# Patient Record
Sex: Female | Born: 1955 | Race: White | Hispanic: No | Marital: Single | Smoking: Former smoker
Health system: Southern US, Community
[De-identification: ages and names within clinical notes are randomized; demographics above are authoritative.]

## PROBLEM LIST (undated history)

## (undated) DIAGNOSIS — Z9289 Personal history of other medical treatment: Secondary | ICD-10-CM

## (undated) DIAGNOSIS — I639 Cerebral infarction, unspecified: Secondary | ICD-10-CM

## (undated) DIAGNOSIS — R531 Weakness: Secondary | ICD-10-CM

## (undated) DIAGNOSIS — I10 Essential (primary) hypertension: Secondary | ICD-10-CM

## (undated) DIAGNOSIS — S82899A Other fracture of unspecified lower leg, initial encounter for closed fracture: Secondary | ICD-10-CM

## (undated) DIAGNOSIS — F0390 Unspecified dementia without behavioral disturbance: Secondary | ICD-10-CM

## (undated) HISTORY — DX: Unspecified dementia, unspecified severity, without behavioral disturbance, psychotic disturbance, mood disturbance, and anxiety: F03.90

## (undated) HISTORY — PX: CATARACT EXTRACTION, BILATERAL: SHX1313

## (undated) HISTORY — PX: TUBAL LIGATION: SHX77

---

## 2007-02-03 DIAGNOSIS — I639 Cerebral infarction, unspecified: Secondary | ICD-10-CM

## 2007-02-03 HISTORY — DX: Cerebral infarction, unspecified: I63.9

## 2007-03-14 ENCOUNTER — Encounter (INDEPENDENT_AMBULATORY_CARE_PROVIDER_SITE_OTHER): Payer: Self-pay | Admitting: Cardiology

## 2007-03-14 ENCOUNTER — Inpatient Hospital Stay (HOSPITAL_COMMUNITY): Admission: EM | Admit: 2007-03-14 | Discharge: 2007-03-17 | Payer: Self-pay | Admitting: Emergency Medicine

## 2007-03-15 ENCOUNTER — Encounter (INDEPENDENT_AMBULATORY_CARE_PROVIDER_SITE_OTHER): Payer: Self-pay | Admitting: Internal Medicine

## 2007-03-16 ENCOUNTER — Encounter: Payer: Self-pay | Admitting: Internal Medicine

## 2007-03-23 ENCOUNTER — Encounter: Payer: Self-pay | Admitting: Interventional Radiology

## 2007-03-23 ENCOUNTER — Ambulatory Visit: Payer: Self-pay | Admitting: Hematology & Oncology

## 2007-04-08 LAB — CBC WITH DIFFERENTIAL/PLATELET
BASO%: 0.4 % (ref 0.0–2.0)
Basophils Absolute: 0 10*3/uL (ref 0.0–0.1)
Eosinophils Absolute: 0.7 10*3/uL — ABNORMAL HIGH (ref 0.0–0.5)
HCT: 37.3 % (ref 34.8–46.6)
HGB: 12.8 g/dL (ref 11.6–15.9)
MCH: 29.7 pg (ref 26.0–34.0)
MCV: 86.2 fL (ref 81.0–101.0)
MONO#: 1 10*3/uL — ABNORMAL HIGH (ref 0.1–0.9)
NEUT#: 8 10*3/uL — ABNORMAL HIGH (ref 1.5–6.5)
Platelets: 256 10*3/uL (ref 145–400)
RDW: 13.1 % (ref 11.3–14.5)
WBC: 13.3 10*3/uL — ABNORMAL HIGH (ref 3.9–10.0)
lymph#: 3.4 10*3/uL — ABNORMAL HIGH (ref 0.9–3.3)

## 2007-04-14 LAB — HYPERCOAGULABLE PANEL, COMPREHENSIVE RET.
Anticardiolipin IgA: 20 [APL'U] (ref <13)
Beta-2-Glycoprotein I IgM: <4 U/mL (ref <10)
Homocysteine: 9.4 umol/L (ref 4.0–15.4)
Protein S Ag, Total: 166 % — ABNORMAL HIGH (ref 70–140)

## 2007-04-15 ENCOUNTER — Ambulatory Visit: Payer: Self-pay | Admitting: Family Medicine

## 2007-04-18 ENCOUNTER — Ambulatory Visit: Payer: Self-pay | Admitting: *Deleted

## 2007-04-27 ENCOUNTER — Encounter: Admission: RE | Admit: 2007-04-27 | Discharge: 2007-07-01 | Payer: Self-pay | Admitting: Family Medicine

## 2007-05-16 ENCOUNTER — Ambulatory Visit: Payer: Self-pay | Admitting: Internal Medicine

## 2007-05-16 ENCOUNTER — Encounter: Payer: Self-pay | Admitting: Family Medicine

## 2007-05-16 LAB — CONVERTED CEMR LAB
BUN: 14 mg/dL (ref 6–23)
Calcium: 9.7 mg/dL (ref 8.4–10.5)
Chloride: 101 meq/L (ref 96–112)
Creatinine, Ser: 0.79 mg/dL (ref 0.40–1.20)
Potassium: 3.7 meq/L (ref 3.5–5.3)

## 2007-06-15 ENCOUNTER — Ambulatory Visit: Payer: Self-pay | Admitting: Internal Medicine

## 2007-07-25 ENCOUNTER — Ambulatory Visit: Payer: Self-pay | Admitting: Internal Medicine

## 2007-10-17 ENCOUNTER — Ambulatory Visit: Payer: Self-pay | Admitting: Family Medicine

## 2007-10-17 ENCOUNTER — Encounter: Payer: Self-pay | Admitting: Family Medicine

## 2007-10-17 LAB — CONVERTED CEMR LAB
Chlamydia, DNA Probe: NEGATIVE
GC Probe Amp, Genital: NEGATIVE

## 2008-10-17 ENCOUNTER — Ambulatory Visit: Payer: Self-pay | Admitting: Family Medicine

## 2008-10-17 ENCOUNTER — Encounter: Payer: Self-pay | Admitting: Family Medicine

## 2008-10-17 LAB — CONVERTED CEMR LAB
ALT: 17 units/L (ref 0–35)
AST: 27 units/L (ref 0–37)
Alkaline Phosphatase: 93 units/L (ref 39–117)
BUN: 12 mg/dL (ref 6–23)
Basophils Absolute: 0 10*3/uL (ref 0.0–0.1)
Creatinine, Ser: 0.72 mg/dL (ref 0.40–1.20)
HDL: 33 mg/dL — ABNORMAL LOW (ref >39)
LDL Cholesterol: 182 mg/dL — ABNORMAL HIGH (ref 0–99)
Lymphocytes Relative: 29 % (ref 12–46)
MCHC: 31.2 g/dL (ref 30.0–36.0)
MCV: 92.7 fL (ref 78.0–100.0)
Platelets: 197 10*3/uL (ref 150–400)
TSH: 19.48 microintl units/mL — ABNORMAL HIGH (ref 0.350–4.500)
Total CHOL/HDL Ratio: 7.8
Triglycerides: 206 mg/dL — ABNORMAL HIGH (ref <150)
WBC: 9.9 10*3/uL (ref 4.0–10.5)

## 2008-11-04 ENCOUNTER — Emergency Department (HOSPITAL_COMMUNITY): Admission: EM | Admit: 2008-11-04 | Discharge: 2008-11-04 | Payer: Self-pay | Admitting: Family Medicine

## 2008-11-16 ENCOUNTER — Ambulatory Visit: Payer: Self-pay | Admitting: Internal Medicine

## 2008-11-16 ENCOUNTER — Encounter: Payer: Self-pay | Admitting: Family Medicine

## 2008-11-27 ENCOUNTER — Encounter: Admission: RE | Admit: 2008-11-27 | Discharge: 2009-02-01 | Payer: Self-pay | Admitting: Family Medicine

## 2009-01-21 ENCOUNTER — Ambulatory Visit: Payer: Self-pay | Admitting: Internal Medicine

## 2009-01-21 LAB — CONVERTED CEMR LAB: Free T4: 1.01 ng/dL (ref 0.80–1.80)

## 2009-02-21 ENCOUNTER — Ambulatory Visit: Payer: Self-pay | Admitting: Internal Medicine

## 2009-03-21 ENCOUNTER — Ambulatory Visit: Payer: Self-pay | Admitting: Internal Medicine

## 2009-03-21 ENCOUNTER — Encounter (INDEPENDENT_AMBULATORY_CARE_PROVIDER_SITE_OTHER): Payer: Self-pay | Admitting: Adult Health

## 2009-03-21 LAB — CONVERTED CEMR LAB
Cholesterol: 161 mg/dL (ref 0–200)
Microalb, Ur: 0.63 mg/dL (ref 0.00–1.89)
TSH: 7.66 microintl units/mL — ABNORMAL HIGH (ref 0.350–4.500)
Total CHOL/HDL Ratio: 6
Triglycerides: 304 mg/dL — ABNORMAL HIGH (ref <150)

## 2009-05-08 ENCOUNTER — Ambulatory Visit: Payer: Self-pay | Admitting: Internal Medicine

## 2009-07-08 ENCOUNTER — Ambulatory Visit: Payer: Self-pay | Admitting: Internal Medicine

## 2009-07-08 ENCOUNTER — Encounter (INDEPENDENT_AMBULATORY_CARE_PROVIDER_SITE_OTHER): Payer: Self-pay | Admitting: Adult Health

## 2009-07-08 LAB — CONVERTED CEMR LAB
ALT: 13 units/L (ref 0–35)
AST: 23 units/L (ref 0–37)
Albumin: 4.3 g/dL (ref 3.5–5.2)
Alkaline Phosphatase: 151 units/L — ABNORMAL HIGH (ref 39–117)
Calcium: 9.5 mg/dL (ref 8.4–10.5)
Chloride: 100 meq/L (ref 96–112)
Creatinine, Ser: 1.71 mg/dL — ABNORMAL HIGH (ref 0.40–1.20)
TSH: 8.76 microintl units/mL — ABNORMAL HIGH (ref 0.350–4.500)
Vit D, 25-Hydroxy: 25 ng/mL — ABNORMAL LOW (ref 30–89)

## 2009-07-12 ENCOUNTER — Emergency Department (HOSPITAL_COMMUNITY): Admission: EM | Admit: 2009-07-12 | Discharge: 2009-07-12 | Payer: Self-pay | Admitting: Family Medicine

## 2009-08-15 ENCOUNTER — Encounter: Admission: RE | Admit: 2009-08-15 | Discharge: 2009-08-15 | Payer: Self-pay | Admitting: Orthopedic Surgery

## 2009-08-23 ENCOUNTER — Encounter: Admission: RE | Admit: 2009-08-23 | Discharge: 2009-08-23 | Payer: Self-pay | Admitting: Family Medicine

## 2010-02-23 ENCOUNTER — Encounter: Payer: Self-pay | Admitting: Family Medicine

## 2010-06-17 NOTE — Discharge Summary (Signed)
NAMEROYALTY, FAKHOURI               ACCOUNT NO.:  [id number]   MEDICAL RECORD NO.:  [id number]          PATIENT TYPE:  INP   LOCATION:  1440                         FACILITY:  [facility]   PHYSICIAN:  Elliot Cousin, M.D.    DATE OF BIRTH:  1955/05/23   DATE OF ADMISSION:  03/14/2007  DATE OF DISCHARGE:  03/17/2007                               DISCHARGE SUMMARY   DISCHARGE DIAGNOSES:  1. Acute left middle cerebellar stroke per magnetic resonance imaging      of the brain.  2. 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  3. 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  Approximately 50% 60% stenosis of      the left internal carotid artery at the cervical-petrous junction.      Mild arthrosclerosis changes involving the cervical-petrous      junction on the right side with a broad-based outpouching at the      superior hypophyseal region.  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  4. Ataxia, secondary to #1.  5. Speech impediment, secondary to chronic hearing loss.  6. Malignant hypertension.  7. Hyperlipidemia.  8. Leukocytosis of unknown origin.  9. Tobacco abuse.   DISCHARGE MEDICATIONS:  1. Metoprolol 25 mg b.i.d.  2. Hydrochlorothiazide 25 mg daily.  3. Potassium chloride 20 mEq daily.  4. Aspirin 325 mg daily.  5. Lovastatin 20 mg nightly.  6. Multivitamin with iron once daily.   DISCHARGE DISPOSITION:  The patient was discharged to home in improved  and stable condition on March 17, 2007.  She has an appointment to  follow up with  interventional radiologist, Dr. Corliss Skains, on Wednesday,  February 18, at 11 o'clock a.m.  She was given information on the  [facility] and advised to make an appointment within the next  week.   CONSULTATIONS:  Bevelyn Buckles. Champey, MD   PROCEDURES PERFORMED:  1. MRI of the brain on March 14, 2007.  2. MRA of the head and neck on March 14, 2007.  3. Four-vessel angiogram on March 16, 2007.  4. A 2-D echocardiogram on March 15, 2007.  The results revealed      overall left ventricular systolic function was vigorous.  Ejection      fraction estimated to be 65% to 75%.  Left ventricular wall      thickness was mildly increased.  Left ventricular diastolic      function parameters were normal.  No likely discrete intracardiac      source of embolism apparent.   HISTORY OF PRESENT ILLNESS:  The patient is a 55 year old woman with a  past medical history significant for long-standing tobacco abuse who  presented to the emergency department on March 14, 2007, with a chief  complaint of losing her balance when she walks.  The loss of balance was  not vertiginous rather she just felt dizzy.  The dizziness was  associated with several episodes of nausea and vomiting.  During the  evaluation in the emergency department, the patient was noted to be  ataxic.  Her lab data were significant for a serum potassium of 3 and an  elevated white blood cell count of 16.3.  The MRI of the brain was  ordered in the emergency department, and it revealed an acute left  middle cerebellar stroke.  The patient was, therefore, admitted for  further evaluation and management.   For additional details, please see the dictated History and Physical.   HOSPITAL COURSE:  1. ACUTE LEFT MIDDLE CEREBELLAR STROKE AND SIGNIFICANT POSTERIOR      CIRCULATION CEREBROVASCULAR DISEASE.  The patient was initially      evaluated by neurologist, Dr. Nash Shearer, in the emergency department.      He ordered an MRI  and MRA of the brain and neck.  The results of      the MRI were significant for a punctate subacute infarct in the      left middle cerebral peduncle and remote lacunar infarcts on the      left.  The MRA of the head and neck revealed probable severe high-      grade stenoses in the vertebrobasilar, particularly on the right.      Once these findings were realized, the patient was started on      aspirin therapy 325 mg daily.  Dr. Nash Shearer recommended assessing      the patient further with a 2-D echocardiogram, fasting lipid panel,      homocystine level, and eventually with a cerebral angiogram.  The      patient's fasting lipid panel revealed a total cholesterol of 207,      triglycerides of 266, HDL of 24, and LDL of 130.  The patient was      started on Zocor nightly.  Her homocysteine level was within normal      limits at 8.7.  Her TSH was within normal limits at 5.4.  Her 2-D      echocardiogram revealed preserved LV function and no obvious      intracardiac source of emboli.  Two days after hospital admission,      the patient underwent a 4-vessel cerebral angiogram as performed by      the radiologist.  The results were significant for advanced      stenotic lesions in the right posterior circulation.  These results      were reviewed with Dr. Nash Shearer and Dr. Corliss Skains and colleagues.      They recommended further evaluation in the outpatient setting for      possible intervention; therefore, the patient has been scheduled to      meet with Dr. Corliss Skains on Wednesday, February 18, at 11 o'clock      a.m.  During the hospital course, the patient received physical and      occupational therapy.  She did have some ataxia as indicated by the      therapist and the neurological evaluation that followed.  The      therapists recommended home health therapies to improve her  balance.  Equipment such as a 3-in-1 commode and rolling walker      were ordered prior to hospital  discharge.  2. MALIGNANT HYPERTENSION.  At the time of the initial hospital      assessment, the patient was malignantly hypertensive with a blood      pressure of 194/103.  She was started on antihypertensive treatment      with metoprolol 50 mg q.6 h.  Over the course of the      hospitalization, the patient became bradycardic and, therefore, the      metoprolol dosing was decreased to 25 mg q.6 h. Norvasc and p.r.n.      hydralazine were added.  Eventually, hydrochlorothiazide was added      for better blood pressure control.  Her blood pressures improved      significantly; however, they were not ideal.  The patient was      counseled on a heart healthy diet during the hospital course.  She      was discharged  to home on a regimen of metoprolol,      hydrochlorothiazide, and potassium chloride for supplemental      support of the hypokalemia.  3. HYPERLIPIDEMIA.  The patient's fasting lipid panel was assessed and      the results are above.  She was started on Zocor during the      hospital course and discharged to home on lovastatin.  4. TOBACCO ABUSE.  The patient was strongly advised to stop smoking as      it increased her risk of stroke or recurrent strokes.  The patient      was very receptive and vowed not to smoke again.  She did receive      official tobacco cessation counseling during her hospital course.  5. LEUKOCYTOSIS OF UNKNOWN ORIGIN.  At the time of the initial      hospital assessment, the patient's white blood cell count was 16.3.      She was completely afebrile and remained afebrile during the entire      hospital course.  An urinalysis was ordered and revealed no      evidence of WBCs, leukocytes, or nitrites.  A chest x-ray was      ordered at the time of the initial hospital assessment and found to      have no evidence of infiltrates.  A follow-up chest x-ray was      ordered as well and revealed only mild bibasilar atelectasis.  The      patient had no  complaints of upper respiratory infection symptoms,      cough, abdominal pain, diarrhea, or painful urination.  The urine      culture did grow out greater than 100,000 colonies of multiple      bacterial morphotypes, but none predominant. There was no clinical      indication to start an antibiotic.  Her white blood cell count      increased to 19.8.  A hematology consult was not obtained during      the hospital course; however, one will be arranged in the      outpatient setting.  Of note, her sed rate was within normal limits      at 22.      Elliot Cousin, M.D.  Electronically Signed     DF/MEDQ  D:  03/22/2007  T:  03/22/2007  Job:  [id number]   cc:   Melvern Banker  Fax:  [phone]   Bevelyn Buckles. Nash Shearer, M.D.  Fax: [phone]

## 2010-06-17 NOTE — H&P (Signed)
[name], [name]               ACCOUNT NO.:  [id number]   MEDICAL RECORD NO.:  [id number]          PATIENT TYPE:  EMS   LOCATION:  ED                           FACILITY:  [facility]   PHYSICIAN:  Darryl D. Prime, MD    DATE OF BIRTH:  Feb 06, 1955   DATE OF ADMISSION:  03/14/2007  DATE OF DISCHARGE:                              HISTORY & PHYSICAL   The patient is full code.  She has no primary care physician.   Total visit time was approximately 58 minutes.   The patient's daughter was the primary historian.  She is a good  historian.   CHIEF COMPLAINT:  Losing balance, nausea and vomiting.   HISTORY OF PRESENT ILLNESS:  Ms. [name] is a 55 year old female with a  history of longstanding tobacco abuse since she was a teenager, one pack  per day, history of possible hypertension, poorly controlled, history of  possible diabetes, not controlled.  Has not seen a doctor for over 30  years, who has over the last eight days had nausea, vomiting, nonbloody.  The patient apparently had a sudden onset of paroxysmal.  The nausea and  vomiting did go away two days prior to admission but 3-4 days ago, she  has been having problems getting out of bed.  When she does get out of  bed, she does have significant apparent ataxia, where she holds onto the  bed rails and to the walls to walk around, which has caused her to stay  in the bed more than usual.  The daughter was significantly concerned  about this, and they presented to the emergency room at [facility]  today.  The patient currently denies any pain, chest pain.  She denies  any visual disturbance.  She denies any apparent vertigo symptoms.  The  patient denies any black or bloody stools.  She notes a decreased p.o.  intake significantly so and some lightheadedness when she does try to  get up.  No syncope.  Patient denies any lower extremity edema,  paroxysmal nocturnal dyspnea, or orthopnea.  She denies any rashes.  She  denies any joint  pain.  In the emergency room, she was given 40 mEq of  potassium chloride.   An MRI was performed of the brain which showed a remote left basal  ganglia possible infarct and a punctate subacute stroke on the left  middle cerebellar peduncle.  There was a concern for a possible  vertebral dissection, but an MRA did not show any dissection.  The  patient was evaluated by [facility] tonight, and  they made recommendations in the emergency room.   PAST MEDICAL/SURGICAL HISTORY:  History of speech impediment and hearing  loss, which she was born with.  She reads lips.  The patient has not  seen a doctor in quiet a while.   ALLERGIES:  No known drug allergies.   MEDICATIONS:  She takes a multivitamin occasionally.   SOCIAL HISTORY:  History of tobacco abuse, as above.  The patient also  has a history of tobacco abuse, as above.  No alcohol abuse.  She is  widowed.   FAMILY HISTORY:  Patient is adopted, but her daughter has a history of  hearing loss.  Apparently, the hearing loss and the associated speech  impediment is hereditary.   REVIEW OF SYSTEMS:  The 14-point review of systems is negative unless  stated above.   PHYSICAL EXAMINATION:  VITAL SIGNS:  Temperature is 97.4 with a blood  pressure of 194/103, 199/115 on the left arm and 188/98 on the right  arm.  The patient's sats are 98% on room air.  GENERAL:  She is a female who looks her stated age, lying in bed in no  acute distress.  HEENT:  Normocephalic and atraumatic.  Pupils are equal, round and  reactive to light.  Extraocular movements are intact.  There is no gross  nystagmus.  The oropharynx shows no lesions.  There is significant  desiccation signs of hypokalemia.  The patient has a mild droop on the  right side facially.  NECK:  Supple with no lymphadenopathy or thyromegaly.  LUNGS:  Clear to auscultation bilaterally.  CARDIOVASCULAR:  Regular rate and rhythm with no murmurs, rubs or   gallops.  Normal S1 and S2.  No S3 or S4.  ABDOMEN:  Soft, nontender, nondistended with no hepatosplenomegaly.  EXTREMITIES:  No clubbing, cyanosis or edema.  NEUROLOGIC:  The sensation and strength is grossly intact.  She does  have a normal finger-to-nose on the left and also a normal heel-to-shin  on the left.  Unremarkable on the right.  The patient was not getting up  and trying to walk.  Patient is alert and oriented x4.  Cranial nerves  II-XII were grossly intact.   Patient's laboratory data shows a sodium of 142, potassium 3, chloride  98, bicarb 30, BUN 9, creatinine 0.7, glucose 147, calcium 9.7.  White  count is elevated at 16.3 with a hemoglobin of 14.6.  Hematocrit of  42.8.  MCV is normal with platelets of 225, segs 67%.   MRI is as above.  MRA is as above.   ASSESSMENT/PLAN:  Patient with a history of left subacute cerebellar  stroke with associated signs and symptoms.  She will be admitted to the  ICU due to the significantly elevated blood pressure.  Will try to  control her blood pressure with beta blockade, nitrates, and gradually  get it down into the range of 140-180 systolic.  We will follow her  neuro status closely with neuro checks.  The head of bed will be  elevated.  Neurology will follow.  We will check a urine drug screen,  EKG, and chest x-ray.  We will get transcranial Doppler versus angiogram  cerebellar to evaluate and completely exclude a possible dissection.  Physical therapy, occupational therapy, case management, and speech  therapy will be consulted.  She will be help n.p.o., and I will get a  swallowing evaluation.  When the results of the chest x-ray are back, if  there are any signs of widened mediastinum, we will get a CT of the  chest to rule out dissection.  We will check lipid panel, PT/INR, PTT.  For her hypovolemia, we will give IV fluids, as this may help with her  stroke symptoms as well.  We will follow in's and out's closely,   however, and her weight.  For her elevated white blood cell count, check  a urinalysis and chest x-ray, as above, to rule out any occult  infection.  For her elevated glucose, will check  a hemoglobin A1C,  although this may reflect stress from her stroke.  Check a TSH as well.  For hypertension, we will control as best possible, as above.  Prophylaxis with her proton pump inhibitor and Colace.  Also, DVT  prophylaxis will be with pneumatic compression devices and then with  heparin or Lovenox when her blood pressure is better controlled.  For  her low potassium, will follow and replete as needed.  She is a full  code.      Darryl D. Prime, MD  Electronically Signed     DDP/MEDQ  D:  03/14/2007  T:  03/15/2007  Job:  [id number]

## 2010-06-17 NOTE — Consult Note (Signed)
[name], [name]               ACCOUNT NO.:  [id number]   MEDICAL RECORD NO.:  [id number]          PATIENT TYPE:  OUT   LOCATION:  XRAY                         FACILITY:  [facility]   PHYSICIAN:  [name], M.D.DATE OF BIRTH:  05-04-1955   DATE OF CONSULTATION:  03/23/2007  DATE OF DISCHARGE:                                 CONSULTATION   CHIEF COMPLAINT:  Cerebrovascular disease.   HISTORY OF PRESENT ILLNESS:  This is a 55 year old female who was  admitted [facility] on March 14, 2007, with a subacute left  cerebellar CVA associated with severe hypertension.  The patient was not  felt to be a candidate for t-PA as she was greater than 3 hours out from  her stroke.  She was given aspirin.  A cerebral angiogram was performed  by Dr. Corliss Bush on March 16, 2007.  This showed an 80-85% stenosis  of the right vertebral artery origin.  There was a distal 60-70%  stenosis.  There was a 50-60% left internal carotid artery stenosis.  There was an outpouching of the right superior hypophyseal region.  She  also had a 70-80% distal basilar artery stenosis.  The patient was asked  to return today to discuss treatment options with Dr. Corliss Bush.  She is  accompanied by her daughter.   PAST MEDICAL HISTORY:  The patient did have a 2-D echo while in the  hospital on March 15, 2007.  This showed an ejection fraction of 65-  75%.  There were no left ventricular wall motion abnormalities noted.  There is no source of emboli noted.  It was questioned whether or not  the patient may need a TEE.  There was a question of untreated diabetes,  question of untreated hypertension.  The patient had not seen a  physician in approximately 30 years prior to this admission.  She has a  history of tobacco use with probable COPD.  I believes she recently quit  smoking.  She was born with a speech impediment as well as hearing loss.  She does read lips.   SURGICAL HISTORY:  The patient  has had no major surgeries.   ALLERGIES:  No known drug allergies.  She had no problems with the  contrast dye at her last angiogram.   CURRENT MEDICATIONS:  1. Metoprolol b.i.d.  2. Hydrochlorothiazide daily.  3. K-Dur 10 mEq b.i.d.  4. Aspirin 325 mg daily.  5. Lovastatin one at bedtime.   We do not have all of the dosages of these medications.  The patient's  daughter provided [place] with the list.  Dr. Corliss Bush recommended starting  Plavix 75 mg daily.   SOCIAL HISTORY:  The patient is widowed.  She has two children, a  supportive daughter and a son with whom she has little contact.  [place]  [place].  She had smoked one pack  cigarettes per day for greater than 30 years.  She is trying to quit.  She does not use alcohol.  She worked [employer] as well as at a [employer].  She is  now on disability.   FAMILY HISTORY:  The patient is adopted.  She knows nothing of her birth  parents.   IMPRESSION AND PLAN:  As noted, the patient presents today accompanied  by her daughter to discuss further treatment options for her  cerebrovascular disease.  Dr. Corliss Bush reviewed the results of the  angiogram with the patient and her daughter.  He pointed out the areas  of stenosis.  He felt that she would be a candidate for stenting of the  right vertebral artery due to the 80-85% stenosis.  It was explained  that this treatment was mainly to prevent future strokes and would do  nothing to improve the stroke which she had already experienced.  The  procedure was described in detail along with the risks and benefits.  The patient was also told that she would probably be at risk for further  strokes if she had no further treatment at this time.   As noted, we did give the patient a prescription for Plavix #30 with one  refill to be taken one daily.  The patient and her daughter would like  to give this situation some more thought.  They will contact us if  they  wish to proceed with further intervention.  Greater than 40 minutes was  spent on this consult.      [name], P.A.    ______________________________  Leslie Silos. [name], M.D.    DR/MEDQ  D:  03/23/2007  T:  03/24/2007  Job:  [id number]   cc:   Leslie P. Pearlean Brownie, MD

## 2010-06-17 NOTE — Consult Note (Signed)
NAMEAMBRIA, [name]               ACCOUNT NO.:  [id number]   MEDICAL RECORD NO.:  [id number]          PATIENT TYPE:  EMS   LOCATION:  ED                           [facility]ACILITY[facility]   PHYSICIAN:  Bevelyn Buckles. Champey, M.D.DATE OF BIRTH:  1955-11-10   DATE OF CONSULTATION:  03/14/2007  DATE OF DISCHARGE:                                 CONSULTATION   REASON FOR CONSULTATION:  Stroke.   HISTORY OF PRESENT ILLNESS:  [name] is a 55 year old Caucasian female  with past medical history of questionable hypertension (the patient has  not seen a doctor or been followed by a physician for several years) who  presented with a 5-day history of feeling off balance.  Eight days ago  she started having nausea and vomiting for a few days that resolved.  She also had a slight headache at that time for a few days.  She did not  get up out of bed secondary to feeling sick.  Then last Wednesday she  was noticed to be very unsteady on her feet and daughter stated she was  always going to the left.  She denies any focal weakness, numbness,  vision changes, speech changes, falls or loss of consciousness.  MRI of  the brain done in the ED showed a small left cerebellar peduncle infarct  with questionable left vertebral artery dissection versus plaque.  MRA  was found to follow.  That showed extensive atherosclerosis of all  vessels without significant dissection.  This was reviewed with  radiology.   PAST MEDICAL HISTORY:  Positive for hypertension and questionable  diabetes.  She states her that her sugar has been elevated;  however,  she does not follow with a doctor.   CURRENT MEDICATIONS:  Multivitamin.   ALLERGIES:  None.   FAMILY HISTORY:  Unknown as the patient is adopted.   SOCIAL HISTORY:  The patient lives with daughter.  Smokes one pack of  cigarettes per day for over 15 years.  She denies any alcohol or drug  use.   REVIEW OF SYSTEMS:  Positive as per HPI.  Review of systems negative as  per HPI in greater than seven other organ systems.   EXAMINATION:  VITALS:  Temperature is 97.4, blood pressure is 194/105 to  234/110, pulse 95, respirations 20.  HEENT:  Normocephalic, atraumatic.  Extraocular muscles are intact.  Pupils equal, round and react to light.  NECK:  Supple.  No carotid bruits.  HEART:  Regular.  LUNGS:  Clear.  ABDOMEN:  Soft.  EXTREMITIES:  Good pulses.  NEUROLOGIC:  The patient is awake, alert and oriented.  Language is  slightly dysarthric but  in her baseline.  Cranial nerves II-XII were  grossly intact.  Motor examination shows good strength in all four  extremities.  No drift is noted.  Sensory examination is within normal  limits to light touch.  Reflexes are 1+ throughout.  Toes are neutral  bilaterally.  Cerebellar function is within normal limits, finger-to-  nose and heel-to-shin.  Gait was not assessed secondary to safety.   MRI of the brain shows small  left cerebellar peduncle infarct with  questionable left surgical artery dissection versus plaque.  MRA  preliminary showed no significant dissection with extensive  atherosclerosis and a narrow basilar artery/posterior circulation.   LABS:  WBC 16.3, hemoglobin 14.6, hematocrit 42.8, platelets 225.  Sodium is 140, potassium is 3.0, chloride is 98, CO2 is 30, BUN 9,  creatinine 0.70.  Glucose 148, calcium is 9.7.   IMPRESSION:  This is a 55 year old with a new infarct and significant  atherosclerosis as described above.  The patient not a candidate for IV  t-PA as symptoms greater than 3 hours out.  Recommend starting an  aspirin per day.  Reviewed the MRI/MRA with Dr. Chestine Spore of radiology.  I  would recommend getting a cerebral angiogram to evaluate posterior  circulation and narrow basilar artery.  I have also recommended checking  a 2-D echocardiogram, fasting lipids and homocysteine level, keep  systolic blood pressure less than 190 and can use labetalol p.r.n. if  needed.  Get PT, OT  consults.  Will follow the patient as consultants.      Bevelyn Buckles. Nash Shearer, M.D.  Electronically Signed     DRC/MEDQ  D:  03/14/2007  T:  03/16/2007  Job:  [id number]

## 2010-09-26 ENCOUNTER — Other Ambulatory Visit: Payer: Self-pay | Admitting: Family Medicine

## 2010-09-26 DIAGNOSIS — Z1231 Encounter for screening mammogram for malignant neoplasm of breast: Secondary | ICD-10-CM

## 2010-09-29 ENCOUNTER — Ambulatory Visit
Admission: RE | Admit: 2010-09-29 | Discharge: 2010-09-29 | Disposition: A | Discharge status: Home / Self Care | Payer: Medicare Other | Source: Ambulatory Visit | Attending: Family Medicine | Admitting: Family Medicine

## 2010-09-29 ENCOUNTER — Ambulatory Visit: Payer: Self-pay

## 2010-09-29 DIAGNOSIS — Z1231 Encounter for screening mammogram for malignant neoplasm of breast: Secondary | ICD-10-CM

## 2010-10-24 LAB — BASIC METABOLIC PANEL
BUN: 9
CO2: 23
CO2: 31
Calcium: 8.9
Calcium: 9.4
Calcium: 9.7
Chloride: 98
Chloride: 99
Creatinine, Ser: 0.64
GFR calc Af Amer: >60
GFR calc Af Amer: >60
GFR calc Af Amer: >60
GFR calc non Af Amer: >60
Glucose, Bld: 103 — ABNORMAL HIGH
Sodium: 131 — ABNORMAL LOW

## 2010-10-24 LAB — PROTIME-INR
INR: 1.1
Prothrombin Time: 13.9

## 2010-10-24 LAB — APTT: aPTT: 33

## 2010-10-24 LAB — BASIC METABOLIC PANEL WITH GFR
CO2: 30
Creatinine, Ser: 0.7
Glucose, Bld: 148 — ABNORMAL HIGH
Potassium: 3 — ABNORMAL LOW
Sodium: 140

## 2010-10-24 LAB — DIFFERENTIAL
Basophils Absolute: 0
Basophils Absolute: 0.1
Basophils Relative: 0
Basophils Relative: 0
Basophils Relative: 1
Eosinophils Absolute: 1 — ABNORMAL HIGH
Eosinophils Relative: 6 — ABNORMAL HIGH
Lymphocytes Relative: 23
Lymphs Abs: 3.7
Lymphs Abs: 4
Monocytes Absolute: 0.7
Monocytes Absolute: 1.1 — ABNORMAL HIGH
Monocytes Relative: 5
Monocytes Relative: 6
Neutro Abs: 10.8 — ABNORMAL HIGH
Neutro Abs: 12.9 — ABNORMAL HIGH
Neutro Abs: 13.9 — ABNORMAL HIGH
Neutrophils Relative %: 66
Neutrophils Relative %: 67

## 2010-10-24 LAB — URINALYSIS, MICROSCOPIC ONLY
Bilirubin Urine: NEGATIVE
Glucose, UA: NEGATIVE
Hgb urine dipstick: NEGATIVE
Ketones, ur: NEGATIVE
Leukocytes, UA: NEGATIVE
Nitrite: NEGATIVE
Protein, ur: NEGATIVE
Specific Gravity, Urine: 1.025
Urobilinogen, UA: 1
pH: 6

## 2010-10-24 LAB — RAPID URINE DRUG SCREEN, HOSP PERFORMED
Amphetamines: NOT DETECTED
Barbiturates: NOT DETECTED
Benzodiazepines: NOT DETECTED
Cocaine: NOT DETECTED
Opiates: NOT DETECTED
Tetrahydrocannabinol: NOT DETECTED

## 2010-10-24 LAB — URINALYSIS, ROUTINE W REFLEX MICROSCOPIC
Glucose, UA: NEGATIVE
Glucose, UA: NEGATIVE
Hgb urine dipstick: NEGATIVE
Specific Gravity, Urine: 1.016
pH: 6

## 2010-10-24 LAB — CBC
HCT: 42.8
Hemoglobin: 14.1
Hemoglobin: 14.6
MCHC: 34
MCHC: 34.1
MCHC: 34.6
MCV: 87.2
Platelets: 225
RBC: 4.44
RBC: 4.7
RBC: 4.9
RDW: 12.8
RDW: 12.9
WBC: 16.3 — ABNORMAL HIGH
WBC: 19.8 — ABNORMAL HIGH

## 2010-10-24 LAB — URINE MICROSCOPIC-ADD ON

## 2010-10-24 LAB — HOMOCYSTEINE: Homocysteine: 8.7

## 2010-10-24 LAB — SEDIMENTATION RATE: Sed Rate: 22

## 2010-10-24 LAB — HEMOGLOBIN A1C
Hgb A1c MFr Bld: 5.8
Mean Plasma Glucose: 129

## 2010-10-24 LAB — B-NATRIURETIC PEPTIDE (CONVERTED LAB): Pro B Natriuretic peptide (BNP): <30

## 2010-10-24 LAB — LIPID PANEL
HDL: 24 — ABNORMAL LOW
Triglycerides: 266 — ABNORMAL HIGH

## 2010-10-24 LAB — TSH: TSH: 5.404

## 2013-03-15 ENCOUNTER — Other Ambulatory Visit: Payer: Self-pay

## 2013-03-15 DIAGNOSIS — Z1231 Encounter for screening mammogram for malignant neoplasm of breast: Secondary | ICD-10-CM

## 2013-03-28 ENCOUNTER — Ambulatory Visit
Admission: RE | Admit: 2013-03-28 | Discharge: 2013-03-28 | Disposition: A | Discharge status: Home / Self Care | Payer: Medicare Other | Source: Ambulatory Visit

## 2013-03-28 DIAGNOSIS — Z1231 Encounter for screening mammogram for malignant neoplasm of breast: Secondary | ICD-10-CM

## 2016-04-02 ENCOUNTER — Other Ambulatory Visit (INDEPENDENT_AMBULATORY_CARE_PROVIDER_SITE_OTHER): Payer: Self-pay | Admitting: Orthopaedic Surgery

## 2016-04-02 ENCOUNTER — Encounter (INDEPENDENT_AMBULATORY_CARE_PROVIDER_SITE_OTHER): Payer: Self-pay | Admitting: Orthopaedic Surgery

## 2016-04-02 ENCOUNTER — Ambulatory Visit (INDEPENDENT_AMBULATORY_CARE_PROVIDER_SITE_OTHER): Payer: Medicare Other

## 2016-04-02 ENCOUNTER — Ambulatory Visit (INDEPENDENT_AMBULATORY_CARE_PROVIDER_SITE_OTHER): Payer: Medicare Other | Admitting: Orthopaedic Surgery

## 2016-04-02 DIAGNOSIS — M79604 Pain in right leg: Secondary | ICD-10-CM

## 2016-04-02 DIAGNOSIS — M25571 Pain in right ankle and joints of right foot: Secondary | ICD-10-CM

## 2016-04-02 DIAGNOSIS — S8261XA Displaced fracture of lateral malleolus of right fibula, initial encounter for closed fracture: Secondary | ICD-10-CM

## 2016-04-02 DIAGNOSIS — S8261XD Displaced fracture of lateral malleolus of right fibula, subsequent encounter for closed fracture with routine healing: Secondary | ICD-10-CM

## 2016-04-02 MED ORDER — HYDROCODONE-ACETAMINOPHEN 5-325 MG PO TABS: 1.0000 | ORAL_TABLET | Freq: Four times a day (QID) | ORAL | 0 refills | Status: DC | PRN

## 2016-04-02 NOTE — Progress Notes (Signed)
Office Visit Note   Patient: [name]           Date of Birth: 1955-11-11           MRN: [id number] Visit Date: 04/02/2016              Requested by: No referring provider defined for this encounter. PCP: [facility]   Assessment & Plan: Visit Diagnoses:  1. Closed displaced fracture of lateral malleolus of right fibula, initial encounter     Plan: Patient has displaced Weber C fibula fracture with widening of medial clear space concerning for syndesmotic injury. We will plan on operative fixation next week to allow for swelling to resolve first. He knows to keep it elevated and ice it at all times. She understands risks benefits alternatives to surgery and wishes to proceed. Pain medicine was prescribed today.  Follow-Up Instructions: Return for 2 week postop visit.   Orders:  Orders Placed This Encounter  Procedures  . XR Ankle Complete Right   Meds ordered this encounter  Medications  . HYDROcodone-acetaminophen (NORCO) 5-325 MG tablet    Sig: Take 1 tablet by mouth every 6 (six) hours as needed.    Dispense:  30 tablet    Refill:  0      Procedures: No procedures performed   Clinical Data: No additional findings.   Subjective: Chief Complaint  Patient presents with  . Right Ankle - Pain    Patient is a 61 year old female who comes in today for acute injury of her right ankle. She fell at her cardiologist office this morning and was sent here for further evaluation and treatment and concern for ankle fracture. She had a normal EKG and was unchanged from prior studies per the patient. She endorses severe pain in her ankle with movement and does not radiate.    Review of Systems  Constitutional: Negative.   HENT: Negative.   Eyes: Negative.   Respiratory: Negative.   Cardiovascular: Negative.   Endocrine: Negative.   Musculoskeletal: Negative.   Neurological: Negative.   Hematological: Negative.   Psychiatric/Behavioral:  Negative.   All other systems reviewed and are negative.    Objective: Vital Signs: There were no vitals taken for this visit.  Physical Exam  Constitutional: She is oriented to person, place, and time. She appears well-developed and well-nourished.  HENT:  Head: Normocephalic and atraumatic.  Eyes: EOM are normal.  Neck: Neck supple.  Pulmonary/Chest: Effort normal.  Abdominal: Soft.  Neurological: She is alert and oriented to person, place, and time.  Skin: Skin is warm. Capillary refill takes less than 2 seconds.  Psychiatric: She has a normal mood and affect. Her behavior is normal. Judgment and thought content normal.  Nursing note and vitals reviewed.   Ortho Exam Exam of the right ankle shows moderate swelling with intact pulses. The foot is warm well-perfused Specialty Comments:  No specialty comments available.  Imaging: Xr Ankle Complete Right  Result Date: 04/02/2016 Displaced Weber C fibula fracture with widening of medial clear space    PMFS History: There are no active problems to display for this patient.  No past medical history on file.  No family history on file.  No past surgical history on file. Social History   Occupational History  . Not on file.   Social History Main Topics  . Smoking status: Never Smoker  . Smokeless tobacco: Never Used  . Alcohol use Not on file  . Drug use: Unknown  .  Sexual activity: Not on file

## 2016-04-07 ENCOUNTER — Encounter (HOSPITAL_BASED_OUTPATIENT_CLINIC_OR_DEPARTMENT_OTHER): Payer: Self-pay | Admitting: *Deleted

## 2016-04-07 NOTE — Progress Notes (Signed)
Medical history reviewed with Dr.Germeroth. Because patient did not have the Stress Test completed which her cardiologist recommended following her echocardiogram, he feels that without this information, the patient is not a safe candidate to be done in the outpatient surgery setting. Called Dr Phoebe Sharps office and left message of same on Sherry's (scheduler) VM that patient needed to be moved to [place]. Called patient's daughter and informed her that they should hear from Dr Phoebe Sharps office and to contact them for questions.

## 2016-04-08 ENCOUNTER — Encounter (HOSPITAL_BASED_OUTPATIENT_CLINIC_OR_DEPARTMENT_OTHER): Payer: Self-pay | Admitting: *Deleted

## 2016-04-14 ENCOUNTER — Encounter (HOSPITAL_BASED_OUTPATIENT_CLINIC_OR_DEPARTMENT_OTHER): Payer: Self-pay | Admitting: *Deleted

## 2016-04-14 NOTE — Progress Notes (Signed)
Pt SDW-pre-op call completed by pt daughter, [name]. Daughter denies any acute cardiopulmonary issues. Daughter stated that pt is currently under the care of [facility]. Daughter not sure if pt hd a cardiac cath; records requested from Dr. Adalberto Ill. Daughter stated that pt last dose of Plavix was Thursday ( 04/09/16) . Daughter stated that pt does not take Aspirin. Daughter made aware to have pt stop taking vitamins, fish oil and herbal medications.  Do not administer any NSAIDs ie: Ibuprofen, Advil, Naproxen, BC and Goody Powder. Daughter verbalized understanding of all pre-op instructions. Anesthesia asked to review pt history ( see note).

## 2016-04-14 NOTE — Progress Notes (Signed)
Anesthesia Chart Review: SAME DAY WORK-UP.   Patient is a 61 year old female scheduled for ORIF right lateral malleolus and syndesmosis on 04/15/16 by Dr. Erlinda Hong. Anesthesia is posted as "Choice." She was moved from [facility] anesthesiologist Dr. Lissa Hoard because she was unable to finish recently ordered stress test (see below and 04/07/16 note by Mal Misty, RN).  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 Patient was adopted.   PCP is with [facility].  Cardiologist is Dr. Wynonia Lawman. He composed a letter on 04/08/16 stating, "May proceed with surgery from a cardiac viewpoint on the above named patient..." He last saw her on 04/02/16. She had an echo 03/2016 showing normal EF; however, she was also scheduled to have myocardial perfusion scan on March 1, but 15 minutes following injection of technetium tetrofosmin she started feeling hot and stepped outside and fell to the floor (felt to be likely vagal reaction). In the process of falling she twisted her right ankle and was noted to have a right ankle deformity. She was evaluated by Dr. Wynonia Lawman. Vitals were stable. EKG showed sinus bradycardia with anterior T wave inversion which was unchanged. She refused to go to the emergency department but did agree to follow-up with [facility] orthopedics. Myocardial perfusion scan was canceled.  Meds include amlodipine, Lipitor, chlorthalidone, Plavix (last dose 04/09/16), Aricept, Norco, KCl.  EKG 04/02/16: SR at 52 bpm, T wave abnormality, consider anterolateral ischemia.  Echo 03/16/16 (Dr. Wynonia Lawman): Conclusion: 1. Moderate to severe concentric LVH with normal global wall motion. Doppler evidence of grade 1 (impaired) diastolic dysfunction. Estimated EF 65%. 2. Mild left atrial enlargement. 3. Trace tricuspid regurgitation.  She is for labs on arrival. Definitive anesthesia plan following anesthesiologist evaluation. Daughter will be coming with patient  tomorrow--patient has dementia.   [facility] Short Stay Center/Anesthesiology Phone [phone] 04/14/2016 3:36 PM

## 2016-04-15 ENCOUNTER — Ambulatory Visit (HOSPITAL_COMMUNITY): Payer: Medicare Other | Admitting: Emergency Medicine

## 2016-04-15 ENCOUNTER — Ambulatory Visit (HOSPITAL_COMMUNITY): Payer: Medicare Other

## 2016-04-15 ENCOUNTER — Encounter (HOSPITAL_COMMUNITY): Payer: Self-pay | Admitting: *Deleted

## 2016-04-15 ENCOUNTER — Ambulatory Visit (HOSPITAL_BASED_OUTPATIENT_CLINIC_OR_DEPARTMENT_OTHER)
Admission: RE | Admit: 2016-04-15 | Discharge: 2016-04-15 | Disposition: A | Discharge status: Home / Self Care | Payer: Medicare Other | Source: Ambulatory Visit | Attending: Orthopaedic Surgery | Admitting: Orthopaedic Surgery

## 2016-04-15 ENCOUNTER — Encounter (HOSPITAL_COMMUNITY)
Admission: RE | Disposition: A | Discharge status: Home / Self Care | Payer: Self-pay | Source: Ambulatory Visit | Attending: Orthopaedic Surgery

## 2016-04-15 DIAGNOSIS — Z87891 Personal history of nicotine dependence: Secondary | ICD-10-CM | POA: Insufficient documentation

## 2016-04-15 DIAGNOSIS — Z79899 Other long term (current) drug therapy: Secondary | ICD-10-CM | POA: Insufficient documentation

## 2016-04-15 DIAGNOSIS — I071 Rheumatic tricuspid insufficiency: Secondary | ICD-10-CM | POA: Diagnosis not present

## 2016-04-15 DIAGNOSIS — S82841A Displaced bimalleolar fracture of right lower leg, initial encounter for closed fracture: Secondary | ICD-10-CM | POA: Insufficient documentation

## 2016-04-15 DIAGNOSIS — F0390 Unspecified dementia without behavioral disturbance: Secondary | ICD-10-CM | POA: Insufficient documentation

## 2016-04-15 DIAGNOSIS — S93431A Sprain of tibiofibular ligament of right ankle, initial encounter: Secondary | ICD-10-CM | POA: Diagnosis not present

## 2016-04-15 DIAGNOSIS — I10 Essential (primary) hypertension: Secondary | ICD-10-CM | POA: Insufficient documentation

## 2016-04-15 DIAGNOSIS — Z419 Encounter for procedure for purposes other than remedying health state, unspecified: Secondary | ICD-10-CM

## 2016-04-15 DIAGNOSIS — I69354 Hemiplegia and hemiparesis following cerebral infarction affecting left non-dominant side: Secondary | ICD-10-CM | POA: Insufficient documentation

## 2016-04-15 DIAGNOSIS — Z7902 Long term (current) use of antithrombotics/antiplatelets: Secondary | ICD-10-CM | POA: Diagnosis not present

## 2016-04-15 DIAGNOSIS — S8261XD Displaced fracture of lateral malleolus of right fibula, subsequent encounter for closed fracture with routine healing: Secondary | ICD-10-CM | POA: Diagnosis not present

## 2016-04-15 DIAGNOSIS — X58XXXA Exposure to other specified factors, initial encounter: Secondary | ICD-10-CM | POA: Insufficient documentation

## 2016-04-15 HISTORY — DX: Essential (primary) hypertension: I10

## 2016-04-15 HISTORY — DX: Cerebral infarction, unspecified: I63.9

## 2016-04-15 HISTORY — PX: ORIF ANKLE FRACTURE: SHX5408

## 2016-04-15 HISTORY — DX: Other fracture of unspecified lower leg, initial encounter for closed fracture: S82.899A

## 2016-04-15 HISTORY — DX: Weakness: R53.1

## 2016-04-15 LAB — CBC
HEMATOCRIT: 38.3 % (ref 36.0–46.0)
HEMOGLOBIN: 12.3 g/dL (ref 12.0–15.0)
MCH: 29.1 pg (ref 26.0–34.0)
MCHC: 32.1 g/dL (ref 30.0–36.0)
MCV: 90.8 fL (ref 78.0–100.0)
Platelets: 255 10*3/uL (ref 150–400)
RBC: 4.22 MIL/uL (ref 3.87–5.11)
RDW: 14.2 % (ref 11.5–15.5)
WBC: 13 10*3/uL — ABNORMAL HIGH (ref 4.0–10.5)

## 2016-04-15 LAB — BASIC METABOLIC PANEL
ANION GAP: 11 (ref 5–15)
BUN: 16 mg/dL (ref 6–20)
CALCIUM: 9.4 mg/dL (ref 8.9–10.3)
CHLORIDE: 102 mmol/L (ref 101–111)
CO2: 27 mmol/L (ref 22–32)
Creatinine, Ser: 1.04 mg/dL — ABNORMAL HIGH (ref 0.44–1.00)
GFR calc Af Amer: >60 mL/min (ref >60)
GFR calc non Af Amer: 57 mL/min — ABNORMAL LOW (ref >60)
GLUCOSE: 104 mg/dL — AB (ref 65–99)
POTASSIUM: 3.7 mmol/L (ref 3.5–5.1)
Sodium: 140 mmol/L (ref 135–145)

## 2016-04-15 SURGERY — OPEN REDUCTION INTERNAL FIXATION (ORIF) ANKLE FRACTURE
Anesthesia: General | Laterality: Right

## 2016-04-15 MED ORDER — PHENYLEPHRINE HCL 10 MG/ML IJ SOLN
INTRAVENOUS | Status: DC | PRN
  Administered 2016-04-15: 50 ug/min via INTRAVENOUS

## 2016-04-15 MED ORDER — HYDROMORPHONE HCL 1 MG/ML IJ SOLN: 0.2500 mg | INTRAMUSCULAR | Status: DC | PRN

## 2016-04-15 MED ORDER — SODIUM CHLORIDE 0.9 % IR SOLN
Status: DC | PRN
  Administered 2016-04-15: 1000 mL

## 2016-04-15 MED ORDER — MIDAZOLAM HCL 2 MG/2ML IJ SOLN
INTRAMUSCULAR | Status: AC
  Filled 2016-04-15: qty 2

## 2016-04-15 MED ORDER — HYDRALAZINE HCL 20 MG/ML IJ SOLN
3.0000 mg | Freq: Once | INTRAMUSCULAR | Status: AC
  Administered 2016-04-15: 3 mg via INTRAVENOUS
  Filled 2016-04-15: qty 0.15
  Filled 2016-04-15: qty 1

## 2016-04-15 MED ORDER — LACTATED RINGERS IV SOLN
Freq: Once | INTRAVENOUS | Status: AC
  Administered 2016-04-15: 11:00:00 via INTRAVENOUS

## 2016-04-15 MED ORDER — CEFAZOLIN SODIUM-DEXTROSE 2-4 GM/100ML-% IV SOLN
2.0000 g | INTRAVENOUS | Status: AC
  Administered 2016-04-15: 2 g via INTRAVENOUS
  Filled 2016-04-15: qty 100

## 2016-04-15 MED ORDER — METHOCARBAMOL 750 MG PO TABS: 750.0000 mg | ORAL_TABLET | Freq: Two times a day (BID) | ORAL | 0 refills | Status: DC | PRN

## 2016-04-15 MED ORDER — FENTANYL CITRATE (PF) 100 MCG/2ML IJ SOLN
INTRAMUSCULAR | Status: AC
  Filled 2016-04-15: qty 4

## 2016-04-15 MED ORDER — SENNOSIDES-DOCUSATE SODIUM 8.6-50 MG PO TABS: 1.0000 | ORAL_TABLET | Freq: Every evening | ORAL | 1 refills | Status: DC | PRN

## 2016-04-15 MED ORDER — BUPIVACAINE-EPINEPHRINE (PF) 0.5% -1:200000 IJ SOLN
INTRAMUSCULAR | Status: DC | PRN
  Administered 2016-04-15: 35 mL via PERINEURAL

## 2016-04-15 MED ORDER — ONDANSETRON HCL 4 MG PO TABS: 4.0000 mg | ORAL_TABLET | Freq: Three times a day (TID) | ORAL | 0 refills | Status: DC | PRN

## 2016-04-15 MED ORDER — PROPOFOL 10 MG/ML IV BOLUS
INTRAVENOUS | Status: DC | PRN
  Administered 2016-04-15: 150 mg via INTRAVENOUS

## 2016-04-15 MED ORDER — PROPOFOL 10 MG/ML IV BOLUS
INTRAVENOUS | Status: AC
  Filled 2016-04-15: qty 20

## 2016-04-15 MED ORDER — ASPIRIN EC 325 MG PO TBEC: 325.0000 mg | DELAYED_RELEASE_TABLET | Freq: Every day | ORAL | 0 refills | Status: DC

## 2016-04-15 MED ORDER — FENTANYL CITRATE (PF) 100 MCG/2ML IJ SOLN
100.0000 ug | Freq: Once | INTRAMUSCULAR | Status: AC
  Administered 2016-04-15: 100 ug via INTRAVENOUS
  Filled 2016-04-15: qty 2

## 2016-04-15 MED ORDER — LIDOCAINE 2% (20 MG/ML) 5 ML SYRINGE
INTRAMUSCULAR | Status: DC | PRN
  Administered 2016-04-15: 80 mg via INTRAVENOUS
  Administered 2016-04-15: 50 mg via INTRAVENOUS

## 2016-04-15 MED ORDER — PHENYLEPHRINE HCL 10 MG/ML IJ SOLN
INTRAMUSCULAR | Status: DC | PRN
  Administered 2016-04-15: 120 ug via INTRAVENOUS

## 2016-04-15 MED ORDER — FENTANYL CITRATE (PF) 100 MCG/2ML IJ SOLN
INTRAMUSCULAR | Status: AC
  Administered 2016-04-15: 100 ug via INTRAVENOUS
  Filled 2016-04-15: qty 2

## 2016-04-15 MED ORDER — FENTANYL CITRATE (PF) 100 MCG/2ML IJ SOLN
INTRAMUSCULAR | Status: DC | PRN
  Administered 2016-04-15 (×2): 50 ug via INTRAVENOUS

## 2016-04-15 MED ORDER — MIDAZOLAM HCL 5 MG/5ML IJ SOLN
INTRAMUSCULAR | Status: DC | PRN
  Administered 2016-04-15: 2 mg via INTRAVENOUS

## 2016-04-15 MED ORDER — PROMETHAZINE HCL 25 MG PO TABS: 25.0000 mg | ORAL_TABLET | Freq: Four times a day (QID) | ORAL | 1 refills | Status: DC | PRN

## 2016-04-15 MED ORDER — OXYCODONE-ACETAMINOPHEN 5-325 MG PO TABS: 1.0000 | ORAL_TABLET | ORAL | 0 refills | Status: DC | PRN

## 2016-04-15 MED ORDER — ONDANSETRON HCL 4 MG/2ML IJ SOLN
INTRAMUSCULAR | Status: DC | PRN
Start: 2016-04-15 — End: 2016-04-15
  Administered 2016-04-15: 4 mg via INTRAVENOUS

## 2016-04-15 MED ORDER — DEXAMETHASONE SODIUM PHOSPHATE 10 MG/ML IJ SOLN
INTRAMUSCULAR | Status: DC | PRN
  Administered 2016-04-15: 5 mg via INTRAVENOUS

## 2016-04-15 SURGICAL SUPPLY — 62 items
BANDAGE ACE 4X5 VEL STRL LF (GAUZE/BANDAGES/DRESSINGS) IMPLANT
BANDAGE ACE 6X5 VEL STRL LF (GAUZE/BANDAGES/DRESSINGS) IMPLANT
BANDAGE ESMARK 6X9 LF (GAUZE/BANDAGES/DRESSINGS) IMPLANT
BIT DRILL 3.5X122MM AO FIT (BIT) ×2 IMPLANT
BLADE SURG 15 STRL LF DISP TIS (BLADE) ×1 IMPLANT
BLADE SURG 15 STRL SS (BLADE) ×3
BNDG CMPR 9X6 STRL LF SNTH (GAUZE/BANDAGES/DRESSINGS) ×1
BNDG COHESIVE 4X5 TAN STRL (GAUZE/BANDAGES/DRESSINGS) ×3 IMPLANT
BNDG ESMARK 6X9 LF (GAUZE/BANDAGES/DRESSINGS) ×3
CANISTER SUCT 3000ML PPV (MISCELLANEOUS) ×3 IMPLANT
COUNTERSINK (MISCELLANEOUS) ×3
COVER SURGICAL LIGHT HANDLE (MISCELLANEOUS) ×3 IMPLANT
CUFF TOURNIQUET SINGLE 34IN LL (TOURNIQUET CUFF) ×2 IMPLANT
CUFF TOURNIQUET SINGLE 44IN (TOURNIQUET CUFF) IMPLANT
DRAPE C-ARM 42X72 X-RAY (DRAPES) ×3 IMPLANT
DRAPE C-ARMOR (DRAPES) ×3 IMPLANT
DRAPE INCISE IOBAN 66X45 STRL (DRAPES) IMPLANT
DRAPE U-SHAPE 47X51 STRL (DRAPES) ×3 IMPLANT
DRILL 2.6X122MM WL AO SHAFT (BIT) ×2 IMPLANT
DURAPREP 26ML APPLICATOR (WOUND CARE) ×5 IMPLANT
ELECT CAUTERY BLADE 6.4 (BLADE) ×3 IMPLANT
ELECT REM PT RETURN 9FT ADLT (ELECTROSURGICAL) ×3
ELECTRODE REM PT RTRN 9FT ADLT (ELECTROSURGICAL) ×1 IMPLANT
GAUZE SPONGE 4X4 12PLY STRL (GAUZE/BANDAGES/DRESSINGS) ×3 IMPLANT
GAUZE XEROFORM 5X9 LF (GAUZE/BANDAGES/DRESSINGS) ×3 IMPLANT
GLOVE SKINSENSE NS SZ7.5 (GLOVE) ×2
GLOVE SKINSENSE STRL SZ7.5 (GLOVE) ×1 IMPLANT
GLOVE SURG SYN 7.5  E (GLOVE) ×4
GLOVE SURG SYN 7.5 E (GLOVE) ×2 IMPLANT
GLOVE SURG SYN 7.5 PF PI (GLOVE) ×2 IMPLANT
GOWN STRL REIN XL XLG (GOWN DISPOSABLE) ×3 IMPLANT
K-WIRE ORTHOPEDIC 1.4X150L (WIRE) ×6
KIT BASIN OR (CUSTOM PROCEDURE TRAY) ×3 IMPLANT
KIT ROOM TURNOVER OR (KITS) ×3 IMPLANT
KWIRE ORTHOPEDIC 1.4X150L (WIRE) IMPLANT
NDL HYPO 25GX1X1/2 BEV (NEEDLE) IMPLANT
NEEDLE HYPO 25GX1X1/2 BEV (NEEDLE) IMPLANT
NS IRRIG 1000ML POUR BTL (IV SOLUTION) ×3 IMPLANT
PACK ORTHO EXTREMITY (CUSTOM PROCEDURE TRAY) ×3 IMPLANT
PAD ARMBOARD 7.5X6 YLW CONV (MISCELLANEOUS) ×6 IMPLANT
PADDING CAST COTTON 6X4 STRL (CAST SUPPLIES) ×1 IMPLANT
PLATE 8H 137MM (Plate) ×2 IMPLANT
SCREW BONE 18 (Screw) ×2 IMPLANT
SCREW BONE 3.5X16MM (Screw) ×2 IMPLANT
SCREW BONE NON-LCKING 3.5X12MM (Screw) ×8 IMPLANT
SCREW COUNTERSINK (MISCELLANEOUS) IMPLANT
SCREW LOCK 3.5X14 (Screw) ×2 IMPLANT
SCREW LOCKING 3.5X12 (Screw) ×2 IMPLANT
SCREW LOCKING 3.5X16MM (Screw) ×2 IMPLANT
SCREW NONLOCK 48MM (Screw) ×2 IMPLANT
SPLINT FIBERGLASS 4X30 (CAST SUPPLIES) ×2 IMPLANT
SUCTION FRAZIER HANDLE 10FR (MISCELLANEOUS) ×2
SUCTION TUBE FRAZIER 10FR DISP (MISCELLANEOUS) ×1 IMPLANT
SUT ETHILON 3 0 PS 1 (SUTURE) IMPLANT
SUT VIC AB 2-0 CT1 27 (SUTURE)
SUT VIC AB 2-0 CT1 TAPERPNT 27 (SUTURE) IMPLANT
SYR CONTROL 10ML LL (SYRINGE) IMPLANT
TOWEL OR 17X24 6PK STRL BLUE (TOWEL DISPOSABLE) ×1 IMPLANT
TOWEL OR 17X26 10 PK STRL BLUE (TOWEL DISPOSABLE) ×2 IMPLANT
TUBE CONNECTING 12'X1/4 (SUCTIONS) ×1
TUBE CONNECTING 12X1/4 (SUCTIONS) ×2 IMPLANT
UNDERPAD 30X30 (UNDERPADS AND DIAPERS) ×3 IMPLANT

## 2016-04-15 NOTE — Op Note (Signed)
Date of Surgery: 04/15/2016  INDICATIONS: Ms. Baird is a 61 y.o.-year-old female who sustained a right ankle fracture; she was indicated for open reduction and internal fixation due to the displaced nature of the articular fracture and came to the operating room today for this procedure. The patient did consent to the procedure after discussion of the risks and benefits.  PREOPERATIVE DIAGNOSIS: right bimalleolar ankle fracture with syndesmosis rupture  POSTOPERATIVE DIAGNOSIS: Same.  PROCEDURE:  1. Open treatment of right ankle fracture with internal fixation. Bimalleolar CPT [id number] 2. Open reduction internal fixation of right ankle syndesmosis rupture. CPT [phone]  SURGEON: N. Eduard Roux, M.D.  ASSIST: none.  ANESTHESIA:  general  TOURNIQUET TIME: see anesthesia record  IV FLUIDS AND URINE: See anesthesia.  ESTIMATED BLOOD LOSS: minimal mL.  IMPLANTS: Stryker Variax 8 hole distal fibula plate  COMPLICATIONS: None.  DESCRIPTION OF PROCEDURE: The patient was brought to the operating room and placed supine on the operating table.  The patient had been signed prior to the procedure and this was documented. The patient had the anesthesia placed by the anesthesiologist.  A nonsterile tourniquet was placed on the upper thigh.  The prep verification and incision time-outs were performed to confirm that this was the correct patient, site, side and location. The patient had an SCD on the opposite lower extremity. The patient did receive antibiotics prior to the incision and was re-dosed during the procedure as needed at indicated intervals.  The patient had the lower extremity prepped and draped in the standard surgical fashion.  The extremity was exsanguinated using an esmarch bandage and the tourniquet was inflated to 300 mm Hg.  A lateral incision was created over the distal fibula. Dissection was carried down to the fascia. The fascia was sharply incised in line with the incision. The  peroneal muscle belly was elevated off of the fibula. Subperiosteal elevation was performed. The fracture was exposed. Immature callus and organized hematoma were removed from the fracture. There was a large posterior butterfly fragment that was separate from the main fracture fragment. I first was able to reduce the posterior butterfly fragment to the proximal fibular shaft. This was clamped in place and then a anterior to posterior lag screw was then placed giving good compression across the fracture. The clamp was then removed and the fracture stay reduced. I then obtained a reduction of the main fracture between the distal and the proximal fibula. This was confirmed under fluoroscopy. The fibula was pulled out to length. Given the orientation of the fracture I was not able to lag the main fracture. 2 K wires were advanced across the distal fracture fragment and into the talus and distal tibia in order to hold it in place provisionally. I then placed a 8 hole precontoured plate on the lateral aspect of the fibula at the appropriate position. Nonlocking screws were placed above the fracture site. I used unicortical locking screws in the distal fracture in order to avoid the ankle joint. The posterior malleolus fracture went into reduction indirectly as a result of the reduction of the fibula. I then performed a stress view which did show widening of the medial clear space. A large periarticular clamp was used to reduce syndesmosis and under fluoroscopic guidance I placed a single tetra cortical syndesmotic screw parallel to the joint. Final x-rays were then taken. The wound was thoroughly irrigated with normal saline. This was then closed in layer fashion using 0 Vicryl, 2-0 Vicryl, 3-0 nylon. Sterile dressings were  applied. Foot was immobilized in a short leg splint. Patient tolerated the procedure well and no immediate competitions.  POSTOPERATIVE PLAN: Ms. Kosel will remain nonweightbearing on this leg for  approximately 6 weeks; Ms. Folger will return for suture removal in 2 weeks.  He will be immobilized in a short leg splint and then transitioned to a CAM walker at his first follow up appointment.  Ms. Ohlson will receive DVT prophylaxis based on other medications, activity level, and risk ratio of bleeding to thrombosis.  Azucena Cecil, MD [place] 1:52 PM

## 2016-04-15 NOTE — Anesthesia Postprocedure Evaluation (Signed)
Anesthesia Post Note  Patient: [name]  Procedure(s) Performed: Procedure(s) (LRB): OPEN REDUCTION INTERNAL FIXATION (ORIF) RIGHT LATERAL MALLEOLUS AND SYNDESMOSIS (Right)  Patient location during evaluation: PACU Anesthesia Type: General and Regional Level of consciousness: awake and alert Pain management: pain level controlled Vital Signs Assessment: post-procedure vital signs reviewed and stable Respiratory status: spontaneous breathing, nonlabored ventilation and respiratory function stable Cardiovascular status: blood pressure returned to baseline and stable Postop Assessment: no signs of nausea or vomiting Anesthetic complications: no       Last Vitals:  Vitals:   04/15/16 1358 04/15/16 1430  BP: (!) 173/97 133/74  Pulse: 80 69  Resp: 13 13  Temp: 36.6 C 36.6 C    Last Pain:  Vitals:   04/15/16 1430  TempSrc:   PainSc: 0-No pain                 Lexii Walsh,W. EDMOND

## 2016-04-15 NOTE — Transfer of Care (Signed)
Immediate Anesthesia Transfer of Care Note  Patient: [name]  Procedure(s) Performed: Procedure(s): OPEN REDUCTION INTERNAL FIXATION (ORIF) RIGHT LATERAL MALLEOLUS AND SYNDESMOSIS (Right)  Patient Location: PACU  Anesthesia Type:GA combined with regional for post-op pain  Level of Consciousness: awake, alert , oriented and patient cooperative  Airway & Oxygen Therapy: Patient Spontanous Breathing  Post-op Assessment: Report given to RN and Post -op Vital signs reviewed and stable  Post vital signs: Reviewed and stable  Last Vitals:  Vitals:   04/15/16 1120 04/15/16 1358  BP: (!) 197/67 (!) 173/97  Pulse: 68 80  Resp: 14 13  Temp:      Last Pain:  Vitals:   04/15/16 0948  TempSrc: Oral      Patients Stated Pain Goal: 3 (37/36/68 1594)  Complications: No apparent anesthesia complications

## 2016-04-15 NOTE — Anesthesia Preprocedure Evaluation (Addendum)
Anesthesia Evaluation  Patient identified by MRN, date of birth, ID band Patient awake    Reviewed: Allergy & Precautions, H&P , NPO status , Patient's Chart, lab work & pertinent test results  Airway Mallampati: II  TM Distance: >3 FB Neck ROM: Full    Dental no notable dental hx. (+) Teeth Intact, Dental Advisory Given   Pulmonary neg pulmonary ROS, former smoker,    Pulmonary exam normal breath sounds clear to auscultation       Cardiovascular hypertension, Pt. on medications  Rhythm:Regular Rate:Normal     Neuro/Psych CVA negative psych ROS   GI/Hepatic negative GI ROS, Neg liver ROS,   Endo/Other  negative endocrine ROS  Renal/GU negative Renal ROS  negative genitourinary   Musculoskeletal   Abdominal   Peds  Hematology negative hematology ROS (+)   Anesthesia Other Findings   Reproductive/Obstetrics negative OB ROS                            Anesthesia Physical Anesthesia Plan  ASA: III  Anesthesia Plan: General   Post-op Pain Management:  Regional for Post-op pain   Induction: Intravenous  Airway Management Planned: LMA  Additional Equipment:   Intra-op Plan:   Post-operative Plan: Extubation in OR  Informed Consent: I have reviewed the patients History and Physical, chart, labs and discussed the procedure including the risks, benefits and alternatives for the proposed anesthesia with the patient or authorized representative who has indicated his/her understanding and acceptance.   Dental advisory given  Plan Discussed with: CRNA  Anesthesia Plan Comments:         Anesthesia Quick Evaluation

## 2016-04-15 NOTE — H&P (Signed)
    PREOPERATIVE H&P  Chief Complaint: Right lateral malleolus fracture, syndesmosis injury  HPI: [name] is a 61 y.o. female who presents for surgical treatment of Right lateral malleolus fracture, syndesmosis injury.  She denies any changes in medical history.  Past Medical History:  Diagnosis Date  . Ankle fracture   . Dementia   . Hypertension   . Left-sided weakness    post stroke  . Stroke [facility]) 2009   Past Surgical History:  Procedure Laterality Date  . CATARACT EXTRACTION, BILATERAL    . TUBAL LIGATION     Social History   Social History  . Marital status: Single    Spouse name: N/A  . Number of children: N/A  . Years of education: N/A   Social History Main Topics  . Smoking status: Former Research scientist (life sciences)  . Smokeless tobacco: Never Used     Comment: "Quit many years ago >10 years  . Alcohol use No  . Drug use: No  . Sexual activity: Not Asked   Other Topics Concern  . None   Social History Narrative  . None   Family History  Problem Relation Age of Onset  . Adopted: Yes   Allergies  Allergen Reactions  . No Known Allergies    Prior to Admission medications   Medication Sig Start Date End Date Taking? Authorizing Provider  amLODipine (NORVASC) 2.5 MG tablet Take 2.5 mg by mouth daily.   Yes Historical Provider, MD  atorvastatin (LIPITOR) 20 MG tablet Take 20 mg by mouth daily.   Yes Historical Provider, MD  chlorthalidone (HYGROTON) 25 MG tablet Take 25 mg by mouth daily.   Yes Historical Provider, MD  clopidogrel (PLAVIX) 75 MG tablet Take 75 mg by mouth daily.   Yes Historical Provider, MD  donepezil (ARICEPT) 5 MG tablet Take 5 mg by mouth at bedtime.   Yes Historical Provider, MD  HYDROcodone-acetaminophen (NORCO) 5-325 MG tablet Take 1 tablet by mouth every 6 (six) hours as needed. 04/02/16  Yes Naiping Ephriam Jenkins, MD  potassium chloride SA (K-DUR,KLOR-CON) 20 MEQ tablet Take 20 mEq by mouth 2 (two) times daily.   Yes Historical Provider, MD      Positive ROS: All other systems have been reviewed and were otherwise negative with the exception of those mentioned in the HPI and as above.  Physical Exam: General: Alert, no acute distress Cardiovascular: No pedal edema Respiratory: No cyanosis, no use of accessory musculature GI: abdomen soft Skin: No lesions in the area of chief complaint Neurologic: Sensation intact distally Psychiatric: Patient is competent for consent with normal mood and affect Lymphatic: no lymphedema  MUSCULOSKELETAL: exam stable  Assessment: Right lateral malleolus fracture, syndesmosis injury  Plan: Plan for Procedure(s): OPEN REDUCTION INTERNAL FIXATION (ORIF) RIGHT LATERAL MALLEOLUS AND SYNDESMOSIS  The risks benefits and alternatives were discussed with the patient including but not limited to the risks of nonoperative treatment, versus surgical intervention including infection, bleeding, nerve injury,  blood clots, cardiopulmonary complications, morbidity, mortality, among others, and they were willing to proceed.   Eduard Roux, MD   04/15/2016 8:19 AM

## 2016-04-15 NOTE — Discharge Instructions (Signed)
° ° °  1. Keep splint clean and dry °2. Elevate foot above level of the heart °3. Take aspirin to prevent blood clots °4. Take pain meds as needed °5. Strict non weight bearing to operative extremity ° °

## 2016-04-15 NOTE — Progress Notes (Signed)
Blood pressures varying 194/88--226/96--230/91---226/90---215/77.  Orders from Dr. Ola Spurr to give Hydralazine 3mg  IV.

## 2016-04-15 NOTE — Anesthesia Procedure Notes (Signed)
Procedure Name: LMA Insertion Date/Time: 04/15/2016 12:18 PM Performed by: Salli Quarry Lorris Carducci Pre-anesthesia Checklist: Patient identified, Emergency Drugs available, Suction available and Patient being monitored Patient Re-evaluated:Patient Re-evaluated prior to inductionOxygen Delivery Method: Circle System Utilized Preoxygenation: Pre-oxygenation with 100% oxygen Intubation Type: IV induction LMA: LMA inserted LMA Size: 4.0 Number of attempts: 1 Airway Equipment and Method: Bite block Placement Confirmation: positive ETCO2 Tube secured with: Tape Dental Injury: Teeth and Oropharynx as per pre-operative assessment

## 2016-04-15 NOTE — Anesthesia Procedure Notes (Signed)
Anesthesia Regional Block: Popliteal block   Pre-Anesthetic Checklist: ,, timeout performed, Correct Patient, Correct Site, Correct Laterality, Correct Procedure, Correct Position, site marked, Risks and benefits discussed, pre-op evaluation,  At surgeon's request and post-op pain management  Laterality: Right  Prep: Maximum Sterile Barrier Precautions used, chloraprep       Needles:  Injection technique: Single-shot  Needle Type: Echogenic Stimulator Needle     Needle Length: 9cm  Needle Gauge: 21     Additional Needles:   Procedures: ultrasound guided, nerve stimulator,,,,,,   Nerve Stimulator or Paresthesia:  Response: Peroneal,  Response: Tibial,   Additional Responses:   Narrative:  Start time: 04/15/2016 11:45 AM End time: 04/15/2016 10:55 AM Injection made incrementally with aspirations every 5 mL. Anesthesiologist: Roderic Palau  Additional Notes: 2% Lidocaine skin wheel.

## 2016-04-16 ENCOUNTER — Telehealth (INDEPENDENT_AMBULATORY_CARE_PROVIDER_SITE_OTHER): Payer: Self-pay | Admitting: Orthopaedic Surgery

## 2016-04-16 ENCOUNTER — Encounter (HOSPITAL_COMMUNITY): Payer: Self-pay | Admitting: Orthopaedic Surgery

## 2016-04-16 NOTE — Telephone Encounter (Signed)
yes

## 2016-04-16 NOTE — Telephone Encounter (Signed)
[facility] CALLING FOR VERBAL FOR 2X WEEK FOR 7 WKS.   [place], [place]

## 2016-04-16 NOTE — Addendum Note (Signed)
Addendum  created 04/16/16 1401 by Glyn Ade, CRNA   Anesthesia Intra Meds edited

## 2016-04-16 NOTE — Telephone Encounter (Signed)
Please advise. Thanks.  

## 2016-04-17 NOTE — Telephone Encounter (Signed)
LMOM

## 2016-04-23 ENCOUNTER — Inpatient Hospital Stay (INDEPENDENT_AMBULATORY_CARE_PROVIDER_SITE_OTHER): Payer: Medicare Other | Admitting: Orthopaedic Surgery

## 2016-04-28 ENCOUNTER — Ambulatory Visit (INDEPENDENT_AMBULATORY_CARE_PROVIDER_SITE_OTHER): Payer: Medicare Other | Admitting: Orthopaedic Surgery

## 2016-04-28 ENCOUNTER — Encounter (INDEPENDENT_AMBULATORY_CARE_PROVIDER_SITE_OTHER): Payer: Self-pay | Admitting: Orthopaedic Surgery

## 2016-04-28 ENCOUNTER — Ambulatory Visit (INDEPENDENT_AMBULATORY_CARE_PROVIDER_SITE_OTHER): Payer: Medicare Other

## 2016-04-28 DIAGNOSIS — S8261XD Displaced fracture of lateral malleolus of right fibula, subsequent encounter for closed fracture with routine healing: Secondary | ICD-10-CM | POA: Diagnosis not present

## 2016-04-28 NOTE — Progress Notes (Signed)
[name] is 2 weeks status post ORIF right trimalleolar ankle fracture. She is doing well. The incision has healed without any signs of infection. X-ray show stable fixation and alignment. The sutures were removed. Continue nonweightbearing. Follow-up in 4 weeks with repeat 3 view x-rays of the right ankle. Questions encouraged and answered.

## 2016-04-30 ENCOUNTER — Telehealth (INDEPENDENT_AMBULATORY_CARE_PROVIDER_SITE_OTHER): Payer: Self-pay | Admitting: *Deleted

## 2016-04-30 NOTE — Telephone Encounter (Signed)
Called to advise.  

## 2016-04-30 NOTE — Telephone Encounter (Signed)
Pt daughter calling with questions about pts boot and ice application.

## 2016-05-05 ENCOUNTER — Telehealth (INDEPENDENT_AMBULATORY_CARE_PROVIDER_SITE_OTHER): Payer: Self-pay | Admitting: Radiology

## 2016-05-05 NOTE — Telephone Encounter (Signed)
[name] , from [facility] called to let Dr. Erlinda Hong know that they are going hold PT on this patient for 1 week till she she sees Dr. Erlinda Hong due to needing a weightbearing status on her.  He states that she is doing fantastist.

## 2016-05-05 NOTE — Telephone Encounter (Signed)
FYI ONLY

## 2016-05-07 NOTE — Telephone Encounter (Signed)
ok 

## 2016-05-12 ENCOUNTER — Telehealth (INDEPENDENT_AMBULATORY_CARE_PROVIDER_SITE_OTHER): Payer: Self-pay | Admitting: *Deleted

## 2016-05-12 NOTE — Telephone Encounter (Signed)
ok 

## 2016-05-12 NOTE — Telephone Encounter (Signed)
See message below °

## 2016-05-12 NOTE — Telephone Encounter (Signed)
[facility] called stating they were going to hold all home health until [facility] has changed per family request. [phone]

## 2016-05-26 ENCOUNTER — Ambulatory Visit (INDEPENDENT_AMBULATORY_CARE_PROVIDER_SITE_OTHER): Payer: Medicare Other

## 2016-05-26 ENCOUNTER — Encounter (INDEPENDENT_AMBULATORY_CARE_PROVIDER_SITE_OTHER): Payer: Self-pay | Admitting: Orthopaedic Surgery

## 2016-05-26 ENCOUNTER — Ambulatory Visit (INDEPENDENT_AMBULATORY_CARE_PROVIDER_SITE_OTHER): Payer: Medicare Other | Admitting: Orthopaedic Surgery

## 2016-05-26 DIAGNOSIS — S8261XD Displaced fracture of lateral malleolus of right fibula, subsequent encounter for closed fracture with routine healing: Secondary | ICD-10-CM

## 2016-05-26 NOTE — Progress Notes (Signed)
[name] is 6 weeks status post ORIF ankle fracture with syndesmotic rupture. She is doing well. She has been noncompliant with nonweightbearing. She's been ambulating quite a bit. X-ray show stable fixation. The scar is well-healed. At this point we'll allow her to weight-bear 50% for 4 weeks then weight-bear as tolerated after that. I'll like to see her back in 4 weeks for repeat x-rays of her right ankle.[name] still plan on removal of the syndesmosis screw at 4 months.

## 2016-06-11 ENCOUNTER — Telehealth (INDEPENDENT_AMBULATORY_CARE_PROVIDER_SITE_OTHER): Payer: Self-pay | Admitting: Orthopaedic Surgery

## 2016-06-11 NOTE — Telephone Encounter (Signed)
FYI ONLY

## 2016-06-11 NOTE — Telephone Encounter (Signed)
[facility] [place] STATED Bush WILL BE D/C TOMORROW FOR [name].  [place]

## 2016-06-23 ENCOUNTER — Ambulatory Visit (INDEPENDENT_AMBULATORY_CARE_PROVIDER_SITE_OTHER): Payer: Medicare Other | Admitting: Orthopaedic Surgery

## 2016-06-30 ENCOUNTER — Ambulatory Visit (INDEPENDENT_AMBULATORY_CARE_PROVIDER_SITE_OTHER): Payer: Medicare Other | Admitting: Orthopaedic Surgery

## 2016-07-06 ENCOUNTER — Ambulatory Visit (INDEPENDENT_AMBULATORY_CARE_PROVIDER_SITE_OTHER): Payer: Medicare Other | Admitting: Orthopaedic Surgery

## 2016-07-06 ENCOUNTER — Ambulatory Visit (INDEPENDENT_AMBULATORY_CARE_PROVIDER_SITE_OTHER): Payer: Medicare Other

## 2016-07-06 DIAGNOSIS — S8261XD Displaced fracture of lateral malleolus of right fibula, subsequent encounter for closed fracture with routine healing: Secondary | ICD-10-CM

## 2016-07-06 NOTE — Progress Notes (Signed)
[name] is a 82 days status post ORIF right ankle. She continues to be noncompliant with her weightbearing restrictions. She denies any pain. She ambulates into the office today without any assistive devices. Her surgical scar is fully healed without any signs of infection. There is no significant swelling. Her x-rays show stable fixation and alignment of the fracture. She may begin to officially weight-bear as tolerated although she started been doing this starting today. I'll see her back in 5 weeks with 3 view x-rays of the right ankle. We will plan on syndesmotic screw removal at 4 months.

## 2016-08-10 ENCOUNTER — Ambulatory Visit (INDEPENDENT_AMBULATORY_CARE_PROVIDER_SITE_OTHER): Payer: Medicare Other | Admitting: Orthopaedic Surgery

## 2016-08-13 ENCOUNTER — Ambulatory Visit (INDEPENDENT_AMBULATORY_CARE_PROVIDER_SITE_OTHER): Payer: Medicare Other | Admitting: Orthopaedic Surgery

## 2016-08-18 ENCOUNTER — Ambulatory Visit (INDEPENDENT_AMBULATORY_CARE_PROVIDER_SITE_OTHER): Payer: Medicare Other | Admitting: Orthopaedic Surgery

## 2016-08-18 ENCOUNTER — Ambulatory Visit (INDEPENDENT_AMBULATORY_CARE_PROVIDER_SITE_OTHER): Payer: Medicare Other

## 2016-08-18 DIAGNOSIS — S8261XD Displaced fracture of lateral malleolus of right fibula, subsequent encounter for closed fracture with routine healing: Secondary | ICD-10-CM

## 2016-08-18 DIAGNOSIS — M79661 Pain in right lower leg: Secondary | ICD-10-CM

## 2016-08-18 NOTE — Progress Notes (Signed)
   Office Visit Note   Patient: [name]           Date of Birth: Nov 14, 1955           MRN: [id number] Visit Date: 08/18/2016              Requested by: Practice, [place], [place] [address] PCP: Practice, [facility]   Assessment & Plan: Visit Diagnoses:  1. Displaced fracture of lateral malleolus of right fibula, subsequent encounter for closed fracture with routine healing     Plan: Patient is 4 months status post ORIF syndesmosis and ankle fracture. We discussed syndesmotic screw removal in the near future. Should we discussed risks benefits alternatives to surgery. Anticipate minimal recovery time from this procedure. We will get her scheduled at her convenience.  Follow-Up Instructions: Return if symptoms worsen or fail to improve.   Orders:  Orders Placed This Encounter  Procedures  . XR Ankle Complete Right   No orders of the defined types were placed in this encounter.     Procedures: No procedures performed   Clinical Data: No additional findings.   Subjective: No chief complaint on file.   Ms. Kerrick is 4 months status post ORIF right ankle fracture with syndesmotic rupture. She doing well overall. She is weightbearing in a Cam Walker. Denies any numbness or tingling.    Review of Systems   Objective: Vital Signs: There were no vitals taken for this visit.  Physical Exam  Ortho Exam Right ankle exam is benign. Fully healed surgical scars. Specialty Comments:  No specialty comments available.  Imaging: Xr Ankle Complete Right  Result Date: 08/18/2016 Healed ankle fracture with stable fixation.    PMFS History: Patient Active Problem List   Diagnosis Date Noted  . Displaced fracture of lateral malleolus of right fibula, subsequent encounter for closed fracture with routine healing    Past Medical History:  Diagnosis Date  . Ankle fracture   . Dementia   . Hypertension   .  Left-sided weakness    post stroke  . Stroke [facility]) 2009    Family History  Problem Relation Age of Onset  . Adopted: Yes    Past Surgical History:  Procedure Laterality Date  . CATARACT EXTRACTION, BILATERAL    . ORIF ANKLE FRACTURE Right 04/15/2016   Procedure: OPEN REDUCTION INTERNAL FIXATION (ORIF) RIGHT LATERAL MALLEOLUS AND SYNDESMOSIS;  Surgeon: Leandrew Koyanagi, MD;  Location: [facility];  Service: Orthopedics;  Laterality: Right;  . TUBAL LIGATION     Social History   Occupational History  . Not on file.   Social History Main Topics  . Smoking status: Former Research scientist (life sciences)  . Smokeless tobacco: Never Used     Comment: "Quit many years ago >10 years  . Alcohol use No  . Drug use: No  . Sexual activity: Not on file

## 2016-08-21 ENCOUNTER — Other Ambulatory Visit (INDEPENDENT_AMBULATORY_CARE_PROVIDER_SITE_OTHER): Payer: Self-pay | Admitting: Orthopaedic Surgery

## 2016-08-21 DIAGNOSIS — Z969 Presence of functional implant, unspecified: Secondary | ICD-10-CM

## 2016-08-25 ENCOUNTER — Encounter (HOSPITAL_COMMUNITY): Payer: Self-pay | Admitting: *Deleted

## 2016-08-25 NOTE — Progress Notes (Signed)
I spoke to [name], she read the names of medications to me, she is bringing mediations to the hospital with her in am.  [name] was not instructed to stop Plavix.  I called Dr Kathrynn Speed who was on call for Dr Erlinda Hong, Dr Colleen Can said that it would not be a problem.

## 2016-08-26 ENCOUNTER — Ambulatory Visit (HOSPITAL_COMMUNITY): Payer: Medicare Other | Admitting: Anesthesiology

## 2016-08-26 ENCOUNTER — Encounter (HOSPITAL_COMMUNITY): Payer: Self-pay | Admitting: Urology

## 2016-08-26 ENCOUNTER — Ambulatory Visit (HOSPITAL_COMMUNITY)
Admission: RE | Admit: 2016-08-26 | Discharge: 2016-08-26 | Disposition: A | Discharge status: Home / Self Care | Payer: Medicare Other | Source: Ambulatory Visit | Attending: Orthopaedic Surgery | Admitting: Orthopaedic Surgery

## 2016-08-26 ENCOUNTER — Encounter (HOSPITAL_COMMUNITY)
Admission: RE | Disposition: A | Discharge status: Home / Self Care | Payer: Self-pay | Source: Ambulatory Visit | Attending: Orthopaedic Surgery

## 2016-08-26 DIAGNOSIS — Z9841 Cataract extraction status, right eye: Secondary | ICD-10-CM | POA: Diagnosis not present

## 2016-08-26 DIAGNOSIS — I69354 Hemiplegia and hemiparesis following cerebral infarction affecting left non-dominant side: Secondary | ICD-10-CM | POA: Insufficient documentation

## 2016-08-26 DIAGNOSIS — Z472 Encounter for removal of internal fixation device: Secondary | ICD-10-CM | POA: Insufficient documentation

## 2016-08-26 DIAGNOSIS — Z9842 Cataract extraction status, left eye: Secondary | ICD-10-CM | POA: Diagnosis not present

## 2016-08-26 DIAGNOSIS — F0390 Unspecified dementia without behavioral disturbance: Secondary | ICD-10-CM | POA: Diagnosis not present

## 2016-08-26 DIAGNOSIS — Z7902 Long term (current) use of antithrombotics/antiplatelets: Secondary | ICD-10-CM | POA: Diagnosis not present

## 2016-08-26 DIAGNOSIS — Z7982 Long term (current) use of aspirin: Secondary | ICD-10-CM | POA: Insufficient documentation

## 2016-08-26 DIAGNOSIS — S8261XD Displaced fracture of lateral malleolus of right fibula, subsequent encounter for closed fracture with routine healing: Secondary | ICD-10-CM

## 2016-08-26 DIAGNOSIS — I10 Essential (primary) hypertension: Secondary | ICD-10-CM | POA: Diagnosis not present

## 2016-08-26 DIAGNOSIS — Z79899 Other long term (current) drug therapy: Secondary | ICD-10-CM | POA: Diagnosis not present

## 2016-08-26 DIAGNOSIS — Z87891 Personal history of nicotine dependence: Secondary | ICD-10-CM | POA: Diagnosis not present

## 2016-08-26 DIAGNOSIS — Z969 Presence of functional implant, unspecified: Secondary | ICD-10-CM

## 2016-08-26 HISTORY — DX: Personal history of other medical treatment: Z92.89

## 2016-08-26 HISTORY — PX: HARDWARE REMOVAL: SHX979

## 2016-08-26 LAB — BASIC METABOLIC PANEL
Anion gap: 10 (ref 5–15)
BUN: 19 mg/dL (ref 6–20)
CHLORIDE: 110 mmol/L (ref 101–111)
CO2: 21 mmol/L — ABNORMAL LOW (ref 22–32)
CREATININE: 0.84 mg/dL (ref 0.44–1.00)
Calcium: 9.5 mg/dL (ref 8.9–10.3)
Glucose, Bld: 86 mg/dL (ref 65–99)
POTASSIUM: 4.2 mmol/L (ref 3.5–5.1)
SODIUM: 141 mmol/L (ref 135–145)

## 2016-08-26 LAB — CBC
HCT: 44.1 % (ref 36.0–46.0)
Hemoglobin: 14 g/dL (ref 12.0–15.0)
MCH: 28.5 pg (ref 26.0–34.0)
MCHC: 31.7 g/dL (ref 30.0–36.0)
MCV: 89.8 fL (ref 78.0–100.0)
PLATELETS: 169 10*3/uL (ref 150–400)
RBC: 4.91 MIL/uL (ref 3.87–5.11)
RDW: 14.2 % (ref 11.5–15.5)
WBC: 11.7 10*3/uL — AB (ref 4.0–10.5)

## 2016-08-26 SURGERY — REMOVAL, HARDWARE
Anesthesia: Monitor Anesthesia Care | Site: Ankle | Laterality: Right

## 2016-08-26 MED ORDER — CHLORHEXIDINE GLUCONATE 4 % EX LIQD: 60.0000 mL | Freq: Once | CUTANEOUS | Status: DC

## 2016-08-26 MED ORDER — 0.9 % SODIUM CHLORIDE (POUR BTL) OPTIME
TOPICAL | Status: DC | PRN
  Administered 2016-08-26: 1000 mL

## 2016-08-26 MED ORDER — PROPOFOL 10 MG/ML IV BOLUS
INTRAVENOUS | Status: DC | PRN
  Administered 2016-08-26 (×11): 10 mg via INTRAVENOUS
  Administered 2016-08-26: 20 mg via INTRAVENOUS

## 2016-08-26 MED ORDER — FENTANYL CITRATE (PF) 250 MCG/5ML IJ SOLN
INTRAMUSCULAR | Status: AC
  Filled 2016-08-26: qty 5

## 2016-08-26 MED ORDER — KETOROLAC TROMETHAMINE 30 MG/ML IJ SOLN: 30.0000 mg | Freq: Once | INTRAMUSCULAR | Status: DC | PRN

## 2016-08-26 MED ORDER — EPHEDRINE SULFATE 50 MG/ML IJ SOLN
INTRAMUSCULAR | Status: DC | PRN
  Administered 2016-08-26 (×3): 5 mg via INTRAVENOUS

## 2016-08-26 MED ORDER — MIDAZOLAM HCL 2 MG/2ML IJ SOLN
1.0000 mg | Freq: Once | INTRAMUSCULAR | Status: AC
  Administered 2016-08-26: 1 mg via INTRAVENOUS

## 2016-08-26 MED ORDER — FENTANYL CITRATE (PF) 100 MCG/2ML IJ SOLN: 25.0000 ug | INTRAMUSCULAR | Status: DC | PRN

## 2016-08-26 MED ORDER — CEFAZOLIN SODIUM-DEXTROSE 2-4 GM/100ML-% IV SOLN
2.0000 g | INTRAVENOUS | Status: AC
  Administered 2016-08-26: 2 g via INTRAVENOUS

## 2016-08-26 MED ORDER — LACTATED RINGERS IV SOLN
INTRAVENOUS | Status: DC
  Administered 2016-08-26: 10:00:00 via INTRAVENOUS

## 2016-08-26 MED ORDER — ROPIVACAINE HCL 5 MG/ML IJ SOLN
INTRAMUSCULAR | Status: DC | PRN
  Administered 2016-08-26: 30 mL via PERINEURAL

## 2016-08-26 MED ORDER — FENTANYL CITRATE (PF) 100 MCG/2ML IJ SOLN
INTRAMUSCULAR | Status: AC
  Administered 2016-08-26: 50 ug via INTRAVENOUS
  Filled 2016-08-26: qty 2

## 2016-08-26 MED ORDER — PROMETHAZINE HCL 25 MG/ML IJ SOLN: 6.2500 mg | INTRAMUSCULAR | Status: DC | PRN

## 2016-08-26 MED ORDER — BUPIVACAINE HCL (PF) 0.25 % IJ SOLN
INTRAMUSCULAR | Status: AC
  Filled 2016-08-26: qty 30

## 2016-08-26 MED ORDER — PROPOFOL 10 MG/ML IV BOLUS
INTRAVENOUS | Status: AC
  Filled 2016-08-26: qty 20

## 2016-08-26 MED ORDER — CEFAZOLIN SODIUM-DEXTROSE 2-4 GM/100ML-% IV SOLN
INTRAVENOUS | Status: AC
  Filled 2016-08-26: qty 100

## 2016-08-26 MED ORDER — MIDAZOLAM HCL 2 MG/2ML IJ SOLN
INTRAMUSCULAR | Status: AC
  Administered 2016-08-26: 1 mg via INTRAVENOUS
  Filled 2016-08-26: qty 2

## 2016-08-26 MED ORDER — DEXAMETHASONE SODIUM PHOSPHATE 10 MG/ML IJ SOLN
INTRAMUSCULAR | Status: DC | PRN
  Administered 2016-08-26: 4 mg via INTRAVENOUS

## 2016-08-26 MED ORDER — HYDROCODONE-ACETAMINOPHEN 5-325 MG PO TABS: 1.0000 | ORAL_TABLET | Freq: Four times a day (QID) | ORAL | 0 refills | Status: DC | PRN

## 2016-08-26 MED ORDER — MEPERIDINE HCL 25 MG/ML IJ SOLN: 6.2500 mg | INTRAMUSCULAR | Status: DC | PRN

## 2016-08-26 MED ORDER — FENTANYL CITRATE (PF) 100 MCG/2ML IJ SOLN
INTRAMUSCULAR | Status: DC | PRN
  Administered 2016-08-26: 50 ug via INTRAVENOUS

## 2016-08-26 MED ORDER — FENTANYL CITRATE (PF) 100 MCG/2ML IJ SOLN
50.0000 ug | Freq: Once | INTRAMUSCULAR | Status: AC
  Administered 2016-08-26: 50 ug via INTRAVENOUS

## 2016-08-26 MED ORDER — ONDANSETRON HCL 4 MG/2ML IJ SOLN
INTRAMUSCULAR | Status: DC | PRN
  Administered 2016-08-26: 4 mg via INTRAVENOUS

## 2016-08-26 MED ORDER — HYDRALAZINE HCL 20 MG/ML IJ SOLN
5.0000 mg | Freq: Once | INTRAMUSCULAR | Status: AC
  Administered 2016-08-26 (×2): 5 mg via INTRAVENOUS
  Filled 2016-08-26: qty 1

## 2016-08-26 SURGICAL SUPPLY — 51 items
BANDAGE ACE 4X5 VEL STRL LF (GAUZE/BANDAGES/DRESSINGS) IMPLANT
BANDAGE ELASTIC 6 VELCRO ST LF (GAUZE/BANDAGES/DRESSINGS) ×2 IMPLANT
BANDAGE ESMARK 6X9 LF (GAUZE/BANDAGES/DRESSINGS) IMPLANT
BLADE SURG 15 STRL LF DISP TIS (BLADE) ×1 IMPLANT
BLADE SURG 15 STRL SS (BLADE) ×3
BNDG CMPR 9X6 STRL LF SNTH (GAUZE/BANDAGES/DRESSINGS) ×1
BNDG COHESIVE 4X5 TAN STRL (GAUZE/BANDAGES/DRESSINGS) ×5 IMPLANT
BNDG ESMARK 6X9 LF (GAUZE/BANDAGES/DRESSINGS) ×3
CANISTER SUCT 3000ML PPV (MISCELLANEOUS) ×3 IMPLANT
COVER SURGICAL LIGHT HANDLE (MISCELLANEOUS) ×3 IMPLANT
CUFF TOURNIQUET SINGLE 34IN LL (TOURNIQUET CUFF) IMPLANT
CUFF TOURNIQUET SINGLE 44IN (TOURNIQUET CUFF) IMPLANT
DRAPE C-ARM 42X72 X-RAY (DRAPES) ×3 IMPLANT
DRAPE C-ARMOR (DRAPES) ×1 IMPLANT
DRAPE INCISE IOBAN 66X45 STRL (DRAPES) IMPLANT
DRAPE OEC MINIVIEW 54X84 (DRAPES) ×2 IMPLANT
DRAPE U-SHAPE 47X51 STRL (DRAPES) ×3 IMPLANT
DURAPREP 26ML APPLICATOR (WOUND CARE) ×3 IMPLANT
ELECT CAUTERY BLADE 6.4 (BLADE) ×3 IMPLANT
ELECT REM PT RETURN 9FT ADLT (ELECTROSURGICAL) ×3
ELECTRODE REM PT RTRN 9FT ADLT (ELECTROSURGICAL) ×1 IMPLANT
GAUZE SPONGE 4X4 12PLY STRL (GAUZE/BANDAGES/DRESSINGS) ×3 IMPLANT
GAUZE XEROFORM 1X8 LF (GAUZE/BANDAGES/DRESSINGS) ×2 IMPLANT
GAUZE XEROFORM 5X9 LF (GAUZE/BANDAGES/DRESSINGS) ×1 IMPLANT
GLOVE BIO SURGEON STRL SZ 6.5 (GLOVE) ×1 IMPLANT
GLOVE BIO SURGEONS STRL SZ 6.5 (GLOVE) ×1
GLOVE SKINSENSE NS SZ7.5 (GLOVE) ×2
GLOVE SKINSENSE STRL SZ7.5 (GLOVE) ×1 IMPLANT
GLOVE SURG SYN 7.5  E (GLOVE) ×4
GLOVE SURG SYN 7.5 E (GLOVE) ×2 IMPLANT
GLOVE SURG SYN 7.5 PF PI (GLOVE) ×2 IMPLANT
GOWN STRL REIN XL XLG (GOWN DISPOSABLE) ×3 IMPLANT
KIT BASIN OR (CUSTOM PROCEDURE TRAY) ×3 IMPLANT
KIT ROOM TURNOVER OR (KITS) ×3 IMPLANT
NDL HYPO 25GX1X1/2 BEV (NEEDLE) IMPLANT
NEEDLE HYPO 25GX1X1/2 BEV (NEEDLE) ×3 IMPLANT
NS IRRIG 1000ML POUR BTL (IV SOLUTION) ×3 IMPLANT
PACK ORTHO EXTREMITY (CUSTOM PROCEDURE TRAY) ×3 IMPLANT
PAD ARMBOARD 7.5X6 YLW CONV (MISCELLANEOUS) ×4 IMPLANT
PADDING CAST COTTON 6X4 STRL (CAST SUPPLIES) ×3 IMPLANT
SUCTION FRAZIER HANDLE 10FR (MISCELLANEOUS) ×2
SUCTION TUBE FRAZIER 10FR DISP (MISCELLANEOUS) ×1 IMPLANT
SUT ETHILON 3 0 PS 1 (SUTURE) ×4 IMPLANT
SUT VIC AB 2-0 CT1 27 (SUTURE) ×6
SUT VIC AB 2-0 CT1 TAPERPNT 27 (SUTURE) IMPLANT
SYR CONTROL 10ML LL (SYRINGE) ×2 IMPLANT
TOWEL OR 17X24 6PK STRL BLUE (TOWEL DISPOSABLE) ×1 IMPLANT
TOWEL OR 17X26 10 PK STRL BLUE (TOWEL DISPOSABLE) ×4 IMPLANT
TUBE CONNECTING 12'X1/4 (SUCTIONS) ×1
TUBE CONNECTING 12X1/4 (SUCTIONS) ×2 IMPLANT
UNDERPAD 30X30 (UNDERPADS AND DIAPERS) ×3 IMPLANT

## 2016-08-26 NOTE — Anesthesia Preprocedure Evaluation (Addendum)
Anesthesia Evaluation  Patient identified by MRN, date of birth, ID band Patient awake    Reviewed: NPO status , Patient's Chart, lab work & pertinent test results, Unable to perform ROS - Chart review only  Airway Mallampati: I       Dental no notable dental hx. (+) Teeth Intact   Pulmonary former smoker,    Pulmonary exam normal breath sounds clear to auscultation       Cardiovascular hypertension, Pt. on medications Normal cardiovascular exam Rhythm:Regular Rate:Normal     Neuro/Psych    GI/Hepatic negative GI ROS, Neg liver ROS,   Endo/Other  negative endocrine ROS  Renal/GU negative Renal ROS     Musculoskeletal   Abdominal Normal abdominal exam  (+)   Peds  Hematology negative hematology ROS (+)   Anesthesia Other Findings   Reproductive/Obstetrics                            Anesthesia Physical Anesthesia Plan  ASA: III  Anesthesia Plan: General   Post-op Pain Management:  Regional for Post-op pain   Induction:   PONV Risk Score and Plan: 3 and Ondansetron, Dexamethasone, Propofol, Midazolam and Treatment may vary due to age or medical condition  Airway Management Planned: LMA  Additional Equipment:   Intra-op Plan:   Post-operative Plan:   Informed Consent: I have reviewed the patients History and Physical, chart, labs and discussed the procedure including the risks, benefits and alternatives for the proposed anesthesia with the patient or authorized representative who has indicated his/her understanding and acceptance.     Plan Discussed with: CRNA and Surgeon  Anesthesia Plan Comments:         Anesthesia Quick Evaluation

## 2016-08-26 NOTE — Transfer of Care (Signed)
Immediate Anesthesia Transfer of Care Note  Patient: [name]  Procedure(s) Performed: Procedure(s): RIGHT ANKLE HARDWARE REMOVAL AND DEBRIDEMENT OF BONE (Right)  Patient Location: PACU  Anesthesia Type:MAC combined with regional for post-op pain  Level of Consciousness: awake, alert  and oriented  Airway & Oxygen Therapy: Patient Spontanous Breathing and Patient connected to face mask oxygen  Post-op Assessment: Report given to RN and Post -op Vital signs reviewed and stable  Post vital signs: Reviewed and stable  Last Vitals:  Vitals:   08/26/16 1142 08/26/16 1147  BP: (!) 190/65 (!) 152/69  Pulse: (!) 58 (!) 51  Resp: 15 14  Temp:     HR 74, RR 16, sats 100%, BP 157/67  Last Pain:  Vitals:   08/26/16 0940  TempSrc: Oral      Patients Stated Pain Goal: 4 (90/90/30 1499)  Complications: No apparent anesthesia complications

## 2016-08-26 NOTE — Op Note (Signed)
   Date of Surgery: 08/26/2016  INDICATIONS: [name] is a 61 y.o.-year-old female with a right ankle syndesmosis screw;  The patient did consent to the procedure after discussion of the risks and benefits.  PREOPERATIVE DIAGNOSIS: Right ankle syndesmosis screw  POSTOPERATIVE DIAGNOSIS: Same.  PROCEDURE:  1. Removal of right ankle syndesmosis screw 2. Excisional debridement of bone of right fibula  SURGEON: N. Eduard Roux, M.D.  ASSIST: None.  ANESTHESIA:  Mac  IV FLUIDS AND URINE: See anesthesia.  ESTIMATED BLOOD LOSS: Minimal mL.  IMPLANTS: None  DRAINS: None  COMPLICATIONS: None.  DESCRIPTION OF PROCEDURE: The patient was brought to the operating room and placed supine on the operating table.  The patient had been signed prior to the procedure and this was documented. The patient had the anesthesia placed by the anesthesiologist.  A time-out was performed to confirm that this was the correct patient, site, side and location. The patient did receive antibiotics prior to the incision and was re-dosed during the procedure as needed at indicated intervals.  A tourniquet was placed.  The patient had the operative extremity prepped and draped in the standard surgical fashion.    A 1 cm incision was made directly over the syndesmotic screw using fluoroscopic guidance. Sharp dissection was carried down to the plate. The soft tissue was elevated off of the plate. This is my screw was then localized using fluoroscopy and backed out by hand. I then performed sharp excisional debridement of the bone using Roger. The wound was then thoroughly irrigated and closed in layer fashion using 2-0 Vicryl, 3-0 nylon. Sterile dressings were applied. Patient tolerated procedure well and no immediate competitions.  POSTOPERATIVE PLAN: Patient is weightbearing as tolerated.  Leslie Cecil, MD [place] 12:34 PM

## 2016-08-26 NOTE — Progress Notes (Signed)
Pt BP 245/97 checked in right arm, 248/95 check in left arm. Pt asymptomatic. HR in 60-70s. Dr. Jillyn Hidden notified

## 2016-08-26 NOTE — Anesthesia Procedure Notes (Signed)
Procedure Name: MAC Date/Time: 08/26/2016 12:10 PM Performed by: Freddie Breech Pre-anesthesia Checklist: Patient identified, Emergency Drugs available, Suction available, Patient being monitored and Timeout performed Patient Re-evaluated:Patient Re-evaluated prior to induction Oxygen Delivery Method: Simple face mask Placement Confirmation: positive ETCO2,  CO2 detector and breath sounds checked- equal and bilateral Dental Injury: Teeth and Oropharynx as per pre-operative assessment

## 2016-08-26 NOTE — Anesthesia Procedure Notes (Addendum)
Anesthesia Regional Block: Popliteal block   Pre-Anesthetic Checklist: ,, timeout performed, Correct Patient, Correct Site, Correct Laterality, Correct Procedure, Correct Position, site marked, Risks and benefits discussed,  Surgical consent,  Pre-op evaluation,  At surgeon's request and post-op pain management  Laterality: Right and Upper  Prep: Dura Prep       Needles:  Injection technique: Single-shot  Needle Type: Echogenic Stimulator Needle     Needle Length: 9cm  Needle Gauge: 21   Needle insertion depth: 3 cm   Additional Needles:   Procedures: ultrasound guided,,,,,,,,  Narrative:  Start time: 08/26/2016 11:32 AM End time: 08/26/2016 11:42 AM Injection made incrementally with aspirations every 5 mL.

## 2016-08-26 NOTE — Anesthesia Postprocedure Evaluation (Signed)
Anesthesia Post Note  Patient: [name]  Procedure(s) Performed: Procedure(s) (LRB): RIGHT ANKLE HARDWARE REMOVAL AND DEBRIDEMENT OF BONE (Right)     Patient location during evaluation: PACU Anesthesia Type: MAC Level of consciousness: awake Pain management: pain level controlled Vital Signs Assessment: post-procedure vital signs reviewed and stable Respiratory status: spontaneous breathing Cardiovascular status: stable Postop Assessment: patient able to bend at knees Anesthetic complications: no    Last Vitals:  Vitals:   08/26/16 1258 08/26/16 1315  BP: 126/70   Pulse: 72   Resp: 18   Temp:  36.6 C    Last Pain:  Vitals:   08/26/16 1315  TempSrc:   PainSc: 0-No pain   Pain Goal: Patients Stated Pain Goal: 4 (08/26/16 0950)               Derek Laughter JR,JOHN [facility] Flow

## 2016-08-26 NOTE — H&P (Signed)
PREOPERATIVE H&P  Chief Complaint: right ankle syndesmosis screw  HPI: [name] is a 61 y.o. female who presents for surgical treatment of right ankle syndesmosis screw.  She denies any changes in medical history.  Past Medical History:  Diagnosis Date  . Ankle fracture   . Dementia   . History of blood transfusion   . Hypertension   . Left-sided weakness    post stroke  . Stroke [facility]) 2009   "6 light" "2 Major"- "I take medication for memory"   Past Surgical History:  Procedure Laterality Date  . CATARACT EXTRACTION, BILATERAL    . ORIF ANKLE FRACTURE Right 04/15/2016   Procedure: OPEN REDUCTION INTERNAL FIXATION (ORIF) RIGHT LATERAL MALLEOLUS AND SYNDESMOSIS;  Surgeon: Leandrew Koyanagi, MD;  Location: [facility];  Service: Orthopedics;  Laterality: Right;  . TUBAL LIGATION     Social History   Social History  . Marital status: Single    Spouse name: N/A  . Number of children: N/A  . Years of education: N/A   Social History Main Topics  . Smoking status: Former Research scientist (life sciences)  . Smokeless tobacco: Never Used     Comment: quit in 1990's  . Alcohol use No  . Drug use: No  . Sexual activity: Not Asked   Other Topics Concern  . None   Social History Narrative  . None   Family History  Problem Relation Age of Onset  . Adopted: Yes   No Known Allergies Prior to Admission medications   Medication Sig Start Date End Date Taking? Authorizing Provider  amLODipine (NORVASC) 2.5 MG tablet Take 2.5 mg by mouth daily.   Yes [provider]  aspirin EC 325 MG tablet Take 1 tablet (325 mg total) by mouth daily. 04/15/16  Yes Leandrew Koyanagi, MD  atorvastatin (LIPITOR) 20 MG tablet Take 20 mg by mouth daily.   Yes [provider]  chlorthalidone (HYGROTON) 25 MG tablet Take 25 mg by mouth daily.   Yes [provider]  clopidogrel (PLAVIX) 75 MG tablet Take 75 mg by mouth daily.   Yes [provider]  donepezil (ARICEPT) 5 MG tablet Take 5 mg by  mouth at bedtime.   Yes [provider]  HYDROcodone-acetaminophen (NORCO) 5-325 MG tablet Take 1 tablet by mouth every 6 (six) hours as needed. 04/02/16  Yes Leandrew Koyanagi, MD  methocarbamol (ROBAXIN) 750 MG tablet Take 1 tablet (750 mg total) by mouth 2 (two) times daily as needed for muscle spasms. 04/15/16  Yes Leandrew Koyanagi, MD  ondansetron (ZOFRAN) 4 MG tablet Take 1-2 tablets (4-8 mg total) by mouth every 8 (eight) hours as needed for nausea or vomiting. 04/15/16  Yes Leandrew Koyanagi, MD  potassium chloride SA (K-DUR,KLOR-CON) 20 MEQ tablet Take 20 mEq by mouth 2 (two) times daily.   Yes [provider]  promethazine (PHENERGAN) 25 MG tablet Take 1 tablet (25 mg total) by mouth every 6 (six) hours as needed for nausea. Patient not taking: Reported on 07/06/2016 04/15/16   Leandrew Koyanagi, MD  senna-docusate (SENOKOT S) 8.6-50 MG tablet Take 1 tablet by mouth at bedtime as needed. Patient not taking: Reported on 07/06/2016 04/15/16   Leandrew Koyanagi, MD     Positive ROS: All other systems have been reviewed and were otherwise negative with the exception of those mentioned in the HPI and as above.  Physical Exam: General: Alert, no acute distress Cardiovascular: No pedal edema Respiratory: No  cyanosis, no use of accessory musculature GI: abdomen soft Skin: No lesions in the area of chief complaint Neurologic: Sensation intact distally Psychiatric: Patient is competent for consent with normal mood and affect Lymphatic: no lymphedema  MUSCULOSKELETAL: exam stable  Assessment: right ankle syndesmosis screw  Plan: Plan for Procedure(s): RIGHT ANKLE HARDWARE REMOVAL AND DEBRIDEMENT OF BONE  The risks benefits and alternatives were discussed with the patient including but not limited to the risks of nonoperative treatment, versus surgical intervention including infection, bleeding, nerve injury,  blood clots, cardiopulmonary complications, morbidity, mortality, among others, and  they were willing to proceed.   Eduard Roux, MD   08/26/2016 10:04 AM

## 2016-08-26 NOTE — Discharge Instructions (Signed)
Postoperative instructions: ° °Weightbearing instructions: as tolerated ° °Keep your dressing and/or splint clean and dry at all times.  You can remove your dressing on post-operative day #3 and change with a dry/sterile dressing or Band-Aids as needed thereafter.   ° °Incision instructions:  Do not soak your incision for 3 weeks after surgery.  If the incision gets wet, pat dry and do not scrub the incision. ° °Pain control:  You have been given a prescription to be taken as directed for post-operative pain control.  In addition, elevate the operative extremity above the heart at all times to prevent swelling and throbbing pain. ° °Take over-the-counter Colace, 100mg by mouth twice a day while taking narcotic pain medications to help prevent constipation. ° °Follow up appointments: °1) 10-14 days for suture removal and wound check. °2) Dr. Ashlin Kreps as scheduled. ° ° ------------------------------------------------------------------------------------------------------------- ° °After Surgery Pain Control: ° °After your surgery, post-surgical discomfort or pain is likely. This discomfort can last several days to a few weeks. At certain times of the day your discomfort may be more intense.  °Did you receive a nerve block?  °A nerve block can provide pain relief for one hour to two days after your surgery. As long as the nerve block is working, you will experience little or no sensation in the area the surgeon operated on.  °As the nerve block wears off, you will begin to experience pain or discomfort. It is very important that you begin taking your prescribed pain medication before the nerve block fully wears off. Treating your pain at the first sign of the block wearing off will ensure your pain is better controlled and more tolerable when full-sensation returns. Do not wait until the pain is intolerable, as the medicine will be less effective. It is better to treat pain in advance than to try and catch up.  °General  Anesthesia:  °If you did not receive a nerve block during your surgery, you will need to start taking your pain medication shortly after your surgery and should continue to do so as prescribed by your surgeon.  °Pain Medication:  °Most commonly we prescribe Vicodin and Percocet for post-operative pain. Both of these medications contain a combination of acetaminophen (Tylenol®) and a narcotic to help control pain.  °· It takes between 30 and 45 minutes before pain medication starts to work. It is important to take your medication before your pain level gets too intense.  °· Nausea is a common side effect of many pain medications. You will want to eat something before taking your pain medicine to help prevent nausea.  °· If you are taking a prescription pain medication that contains acetaminophen, we recommend that you do not take additional over the counter acetaminophen (Tylenol®).  °Other pain relieving options:  °· Using a cold pack to ice the affected area a few times a day (15 to 20 minutes at a time) can help to relieve pain, reduce swelling and bruising.  °· Elevation of the affected area can also help to reduce pain and swelling. ° ° ° °

## 2016-08-27 ENCOUNTER — Encounter (HOSPITAL_COMMUNITY): Payer: Self-pay | Admitting: Orthopaedic Surgery

## 2016-09-21 ENCOUNTER — Encounter (INDEPENDENT_AMBULATORY_CARE_PROVIDER_SITE_OTHER): Payer: Self-pay | Admitting: Orthopaedic Surgery

## 2016-09-21 ENCOUNTER — Ambulatory Visit (INDEPENDENT_AMBULATORY_CARE_PROVIDER_SITE_OTHER): Payer: Medicare Other | Admitting: Orthopaedic Surgery

## 2016-09-21 DIAGNOSIS — S8261XD Displaced fracture of lateral malleolus of right fibula, subsequent encounter for closed fracture with routine healing: Secondary | ICD-10-CM

## 2016-09-21 NOTE — Progress Notes (Signed)
Patient is almost 4 weeks status post syndesmotic screw removal. She follows up today. Her incision is healed. The sutures were removed. She has evidence of infection.At this point she may increase activity as tolerated.  Discontinue Cam boot.  Follow-up as needed.Questions encouraged and answered.

## 2017-06-09 ENCOUNTER — Other Ambulatory Visit: Payer: Self-pay | Admitting: Family Medicine

## 2017-06-09 DIAGNOSIS — Z1231 Encounter for screening mammogram for malignant neoplasm of breast: Secondary | ICD-10-CM

## 2017-07-13 ENCOUNTER — Ambulatory Visit
Admission: RE | Admit: 2017-07-13 | Discharge: 2017-07-13 | Disposition: A | Discharge status: Home / Self Care | Payer: Medicare Other | Source: Ambulatory Visit | Attending: Family Medicine | Admitting: Family Medicine

## 2017-07-13 DIAGNOSIS — Z1231 Encounter for screening mammogram for malignant neoplasm of breast: Secondary | ICD-10-CM

## 2017-07-14 ENCOUNTER — Other Ambulatory Visit: Payer: Self-pay | Admitting: Family Medicine

## 2017-07-14 DIAGNOSIS — R928 Other abnormal and inconclusive findings on diagnostic imaging of breast: Secondary | ICD-10-CM

## 2017-07-19 ENCOUNTER — Ambulatory Visit
Admission: RE | Admit: 2017-07-19 | Discharge: 2017-07-19 | Disposition: A | Discharge status: Home / Self Care | Payer: Medicare Other | Source: Ambulatory Visit | Attending: Family Medicine | Admitting: Family Medicine

## 2017-07-19 ENCOUNTER — Other Ambulatory Visit: Payer: Self-pay | Admitting: Family Medicine

## 2017-07-19 DIAGNOSIS — N6310 Unspecified lump in the right breast, unspecified quadrant: Secondary | ICD-10-CM

## 2017-07-19 DIAGNOSIS — R928 Other abnormal and inconclusive findings on diagnostic imaging of breast: Secondary | ICD-10-CM

## 2017-07-28 ENCOUNTER — Ambulatory Visit
Admission: RE | Admit: 2017-07-28 | Discharge: 2017-07-28 | Disposition: A | Discharge status: Home / Self Care | Payer: Medicare Other | Source: Ambulatory Visit | Attending: Family Medicine | Admitting: Family Medicine

## 2017-07-28 ENCOUNTER — Inpatient Hospital Stay (HOSPITAL_COMMUNITY): Admit: 2017-07-28 | Payer: Self-pay

## 2017-07-28 DIAGNOSIS — N6310 Unspecified lump in the right breast, unspecified quadrant: Secondary | ICD-10-CM

## 2017-09-21 ENCOUNTER — Encounter (HOSPITAL_COMMUNITY): Payer: Self-pay | Admitting: Family Medicine

## 2017-12-23 ENCOUNTER — Encounter: Payer: Self-pay | Admitting: Radiation Oncology

## 2017-12-27 ENCOUNTER — Ambulatory Visit
Admission: RE | Admit: 2017-12-27 | Discharge: 2017-12-27 | Disposition: A | Discharge status: Home / Self Care | Payer: Self-pay | Source: Ambulatory Visit | Attending: Radiation Oncology | Admitting: Radiation Oncology

## 2017-12-27 ENCOUNTER — Other Ambulatory Visit: Payer: Self-pay | Admitting: Radiation Oncology

## 2017-12-27 DIAGNOSIS — C8590 Non-Hodgkin lymphoma, unspecified, unspecified site: Secondary | ICD-10-CM

## 2018-01-02 ENCOUNTER — Emergency Department (HOSPITAL_COMMUNITY): Payer: Medicare Other

## 2018-01-02 ENCOUNTER — Inpatient Hospital Stay (HOSPITAL_COMMUNITY): Payer: Medicare Other

## 2018-01-02 ENCOUNTER — Encounter (HOSPITAL_COMMUNITY): Payer: Self-pay | Admitting: Emergency Medicine

## 2018-01-02 ENCOUNTER — Inpatient Hospital Stay (HOSPITAL_COMMUNITY)
Admission: EM | Admit: 2018-01-02 | Discharge: 2018-02-02 | DRG: 871 | Disposition: E | Payer: Medicare Other | Attending: Family Medicine | Admitting: Family Medicine

## 2018-01-02 DIAGNOSIS — R471 Dysarthria and anarthria: Secondary | ICD-10-CM | POA: Diagnosis present

## 2018-01-02 DIAGNOSIS — R7989 Other specified abnormal findings of blood chemistry: Secondary | ICD-10-CM | POA: Diagnosis not present

## 2018-01-02 DIAGNOSIS — I6340 Cerebral infarction due to embolism of unspecified cerebral artery: Secondary | ICD-10-CM

## 2018-01-02 DIAGNOSIS — I519 Heart disease, unspecified: Secondary | ICD-10-CM

## 2018-01-02 DIAGNOSIS — Z933 Colostomy status: Secondary | ICD-10-CM | POA: Diagnosis not present

## 2018-01-02 DIAGNOSIS — I4891 Unspecified atrial fibrillation: Secondary | ICD-10-CM | POA: Diagnosis present

## 2018-01-02 DIAGNOSIS — K1231 Oral mucositis (ulcerative) due to antineoplastic therapy: Secondary | ICD-10-CM | POA: Diagnosis not present

## 2018-01-02 DIAGNOSIS — E871 Hypo-osmolality and hyponatremia: Secondary | ICD-10-CM | POA: Diagnosis present

## 2018-01-02 DIAGNOSIS — E876 Hypokalemia: Secondary | ICD-10-CM | POA: Diagnosis present

## 2018-01-02 DIAGNOSIS — R0902 Hypoxemia: Secondary | ICD-10-CM | POA: Diagnosis not present

## 2018-01-02 DIAGNOSIS — R6521 Severe sepsis with septic shock: Secondary | ICD-10-CM | POA: Diagnosis not present

## 2018-01-02 DIAGNOSIS — I471 Supraventricular tachycardia: Secondary | ICD-10-CM | POA: Diagnosis present

## 2018-01-02 DIAGNOSIS — I69351 Hemiplegia and hemiparesis following cerebral infarction affecting right dominant side: Secondary | ICD-10-CM | POA: Diagnosis not present

## 2018-01-02 DIAGNOSIS — Z7189 Other specified counseling: Secondary | ICD-10-CM

## 2018-01-02 DIAGNOSIS — D709 Neutropenia, unspecified: Secondary | ICD-10-CM | POA: Diagnosis not present

## 2018-01-02 DIAGNOSIS — R6520 Severe sepsis without septic shock: Secondary | ICD-10-CM | POA: Diagnosis not present

## 2018-01-02 DIAGNOSIS — W19XXXA Unspecified fall, initial encounter: Secondary | ICD-10-CM | POA: Diagnosis present

## 2018-01-02 DIAGNOSIS — I959 Hypotension, unspecified: Secondary | ICD-10-CM | POA: Diagnosis present

## 2018-01-02 DIAGNOSIS — E872 Acidosis: Secondary | ICD-10-CM | POA: Diagnosis present

## 2018-01-02 DIAGNOSIS — R5081 Fever presenting with conditions classified elsewhere: Secondary | ICD-10-CM | POA: Diagnosis present

## 2018-01-02 DIAGNOSIS — C8330 Diffuse large B-cell lymphoma, unspecified site: Secondary | ICD-10-CM | POA: Diagnosis present

## 2018-01-02 DIAGNOSIS — I110 Hypertensive heart disease with heart failure: Secondary | ICD-10-CM | POA: Diagnosis present

## 2018-01-02 DIAGNOSIS — D701 Agranulocytosis secondary to cancer chemotherapy: Secondary | ICD-10-CM | POA: Diagnosis not present

## 2018-01-02 DIAGNOSIS — R29707 NIHSS score 7: Secondary | ICD-10-CM | POA: Diagnosis not present

## 2018-01-02 DIAGNOSIS — T451X5A Adverse effect of antineoplastic and immunosuppressive drugs, initial encounter: Secondary | ICD-10-CM

## 2018-01-02 DIAGNOSIS — D62 Acute posthemorrhagic anemia: Secondary | ICD-10-CM | POA: Diagnosis not present

## 2018-01-02 DIAGNOSIS — D649 Anemia, unspecified: Secondary | ICD-10-CM | POA: Diagnosis not present

## 2018-01-02 DIAGNOSIS — I429 Cardiomyopathy, unspecified: Secondary | ICD-10-CM | POA: Diagnosis present

## 2018-01-02 DIAGNOSIS — F0390 Unspecified dementia without behavioral disturbance: Secondary | ICD-10-CM | POA: Diagnosis present

## 2018-01-02 DIAGNOSIS — Z79899 Other long term (current) drug therapy: Secondary | ICD-10-CM

## 2018-01-02 DIAGNOSIS — I21A1 Myocardial infarction type 2: Secondary | ICD-10-CM | POA: Diagnosis present

## 2018-01-02 DIAGNOSIS — I9589 Other hypotension: Secondary | ICD-10-CM | POA: Diagnosis not present

## 2018-01-02 DIAGNOSIS — D6869 Other thrombophilia: Secondary | ICD-10-CM | POA: Diagnosis present

## 2018-01-02 DIAGNOSIS — H9192 Unspecified hearing loss, left ear: Secondary | ICD-10-CM | POA: Diagnosis not present

## 2018-01-02 DIAGNOSIS — R0602 Shortness of breath: Secondary | ICD-10-CM | POA: Diagnosis not present

## 2018-01-02 DIAGNOSIS — I82811 Embolism and thrombosis of superficial veins of right lower extremities: Secondary | ICD-10-CM | POA: Diagnosis present

## 2018-01-02 DIAGNOSIS — I5022 Chronic systolic (congestive) heart failure: Secondary | ICD-10-CM | POA: Diagnosis present

## 2018-01-02 DIAGNOSIS — A419 Sepsis, unspecified organism: Secondary | ICD-10-CM | POA: Diagnosis not present

## 2018-01-02 DIAGNOSIS — R339 Retention of urine, unspecified: Secondary | ICD-10-CM | POA: Diagnosis not present

## 2018-01-02 DIAGNOSIS — G9341 Metabolic encephalopathy: Secondary | ICD-10-CM | POA: Diagnosis present

## 2018-01-02 DIAGNOSIS — J9690 Respiratory failure, unspecified, unspecified whether with hypoxia or hypercapnia: Secondary | ICD-10-CM

## 2018-01-02 DIAGNOSIS — I824Z1 Acute embolism and thrombosis of unspecified deep veins of right distal lower extremity: Secondary | ICD-10-CM | POA: Diagnosis not present

## 2018-01-02 DIAGNOSIS — J9601 Acute respiratory failure with hypoxia: Secondary | ICD-10-CM | POA: Diagnosis present

## 2018-01-02 DIAGNOSIS — I5020 Unspecified systolic (congestive) heart failure: Secondary | ICD-10-CM | POA: Diagnosis not present

## 2018-01-02 DIAGNOSIS — Z515 Encounter for palliative care: Secondary | ICD-10-CM

## 2018-01-02 DIAGNOSIS — D696 Thrombocytopenia, unspecified: Secondary | ICD-10-CM | POA: Diagnosis not present

## 2018-01-02 DIAGNOSIS — N179 Acute kidney failure, unspecified: Secondary | ICD-10-CM | POA: Diagnosis present

## 2018-01-02 DIAGNOSIS — H9190 Unspecified hearing loss, unspecified ear: Secondary | ICD-10-CM | POA: Diagnosis present

## 2018-01-02 DIAGNOSIS — D499 Neoplasm of unspecified behavior of unspecified site: Secondary | ICD-10-CM | POA: Diagnosis not present

## 2018-01-02 DIAGNOSIS — R17 Unspecified jaundice: Secondary | ICD-10-CM | POA: Diagnosis not present

## 2018-01-02 DIAGNOSIS — I82451 Acute embolism and thrombosis of right peroneal vein: Secondary | ICD-10-CM | POA: Diagnosis present

## 2018-01-02 DIAGNOSIS — I82812 Embolism and thrombosis of superficial veins of left lower extremities: Secondary | ICD-10-CM | POA: Diagnosis present

## 2018-01-02 DIAGNOSIS — R4789 Other speech disturbances: Secondary | ICD-10-CM | POA: Diagnosis not present

## 2018-01-02 DIAGNOSIS — R197 Diarrhea, unspecified: Secondary | ICD-10-CM | POA: Diagnosis not present

## 2018-01-02 DIAGNOSIS — D61818 Other pancytopenia: Secondary | ICD-10-CM

## 2018-01-02 DIAGNOSIS — Z9889 Other specified postprocedural states: Secondary | ICD-10-CM | POA: Diagnosis not present

## 2018-01-02 DIAGNOSIS — I214 Non-ST elevation (NSTEMI) myocardial infarction: Secondary | ICD-10-CM

## 2018-01-02 DIAGNOSIS — R29810 Facial weakness: Secondary | ICD-10-CM | POA: Diagnosis present

## 2018-01-02 DIAGNOSIS — D703 Neutropenia due to infection: Secondary | ICD-10-CM | POA: Diagnosis present

## 2018-01-02 DIAGNOSIS — R571 Hypovolemic shock: Secondary | ICD-10-CM | POA: Diagnosis present

## 2018-01-02 DIAGNOSIS — Z66 Do not resuscitate: Secondary | ICD-10-CM | POA: Diagnosis not present

## 2018-01-02 DIAGNOSIS — I639 Cerebral infarction, unspecified: Secondary | ICD-10-CM | POA: Diagnosis not present

## 2018-01-02 DIAGNOSIS — Z7982 Long term (current) use of aspirin: Secondary | ICD-10-CM

## 2018-01-02 DIAGNOSIS — J189 Pneumonia, unspecified organism: Secondary | ICD-10-CM | POA: Diagnosis present

## 2018-01-02 DIAGNOSIS — Z7902 Long term (current) use of antithrombotics/antiplatelets: Secondary | ICD-10-CM

## 2018-01-02 DIAGNOSIS — I2510 Atherosclerotic heart disease of native coronary artery without angina pectoris: Secondary | ICD-10-CM | POA: Diagnosis present

## 2018-01-02 DIAGNOSIS — K529 Noninfective gastroenteritis and colitis, unspecified: Secondary | ICD-10-CM | POA: Diagnosis present

## 2018-01-02 DIAGNOSIS — Z87891 Personal history of nicotine dependence: Secondary | ICD-10-CM

## 2018-01-02 DIAGNOSIS — R1310 Dysphagia, unspecified: Secondary | ICD-10-CM | POA: Diagnosis not present

## 2018-01-02 LAB — I-STAT CG4 LACTIC ACID, ED
Lactic Acid, Venous: 7.66 mmol/L (ref 0.5–1.9)
Lactic Acid, Venous: 3.91 mmol/L (ref 0.5–1.9)

## 2018-01-02 LAB — URINALYSIS, ROUTINE W REFLEX MICROSCOPIC
Bilirubin Urine: NEGATIVE
Glucose, UA: 50 mg/dL — AB
Ketones, ur: NEGATIVE mg/dL
Leukocytes, UA: NEGATIVE
Nitrite: NEGATIVE
Protein, ur: 30 mg/dL — AB
Specific Gravity, Urine: 1.017 (ref 1.005–1.030)
pH: 5 (ref 5.0–8.0)

## 2018-01-02 LAB — PROTIME-INR
INR: 1.46
Prothrombin Time: 17.5 seconds — ABNORMAL HIGH (ref 11.4–15.2)

## 2018-01-02 LAB — CBC
HCT: 27.2 % — ABNORMAL LOW (ref 36.0–46.0)
HEMOGLOBIN: 8.8 g/dL — AB (ref 12.0–15.0)
MCH: 29.2 pg (ref 26.0–34.0)
MCHC: 32.4 g/dL (ref 30.0–36.0)
MCV: 90.4 fL (ref 80.0–100.0)
Platelets: 27 10*3/uL — CL (ref 150–400)
RBC: 3.01 MIL/uL — ABNORMAL LOW (ref 3.87–5.11)
RDW: 15.6 % — ABNORMAL HIGH (ref 11.5–15.5)
WBC: 0.3 10*3/uL — CL (ref 4.0–10.5)
nRBC: 14.7 % — ABNORMAL HIGH (ref 0.0–0.2)

## 2018-01-02 LAB — COMPREHENSIVE METABOLIC PANEL
ALT: 33 U/L (ref 0–44)
AST: 63 U/L — ABNORMAL HIGH (ref 15–41)
Albumin: 2.1 g/dL — ABNORMAL LOW (ref 3.5–5.0)
Alkaline Phosphatase: 44 U/L (ref 38–126)
Anion gap: 18 — ABNORMAL HIGH (ref 5–15)
BUN: 43 mg/dL — ABNORMAL HIGH (ref 8–23)
CO2: 14 mmol/L — ABNORMAL LOW (ref 22–32)
Calcium: 8.2 mg/dL — ABNORMAL LOW (ref 8.9–10.3)
Chloride: 98 mmol/L (ref 98–111)
Creatinine, Ser: 2.48 mg/dL — ABNORMAL HIGH (ref 0.44–1.00)
GFR calc Af Amer: 23 mL/min — ABNORMAL LOW (ref >60)
GFR calc non Af Amer: 20 mL/min — ABNORMAL LOW (ref >60)
Glucose, Bld: 212 mg/dL — ABNORMAL HIGH (ref 70–99)
POTASSIUM: 3.1 mmol/L — AB (ref 3.5–5.1)
SODIUM: 130 mmol/L — AB (ref 135–145)
Total Bilirubin: 1.8 mg/dL — ABNORMAL HIGH (ref 0.3–1.2)
Total Protein: 5.7 g/dL — ABNORMAL LOW (ref 6.5–8.1)

## 2018-01-02 LAB — BLOOD GAS, ARTERIAL
Acid-base deficit: 7.3 mmol/L — ABNORMAL HIGH (ref 0.0–2.0)
BICARBONATE: 16.4 mmol/L — AB (ref 20.0–28.0)
FIO2: 0.21
O2 Saturation: 93.1 %
Patient temperature: 98.3
pCO2 arterial: 26.2 mmHg — ABNORMAL LOW (ref 32.0–48.0)
pH, Arterial: 7.412 (ref 7.350–7.450)
pO2, Arterial: 67.6 mmHg — ABNORMAL LOW (ref 83.0–108.0)

## 2018-01-02 LAB — GLUCOSE, CAPILLARY: Glucose-Capillary: 140 mg/dL — ABNORMAL HIGH (ref 70–99)

## 2018-01-02 LAB — CBC WITH DIFFERENTIAL/PLATELET
HCT: 11.2 % — ABNORMAL LOW (ref 36.0–46.0)
Hemoglobin: 3.3 g/dL — CL (ref 12.0–15.0)
MCH: 28.7 pg (ref 26.0–34.0)
MCHC: 29.5 g/dL — ABNORMAL LOW (ref 30.0–36.0)
MCV: 97.4 fL (ref 80.0–100.0)
PLATELETS: 32 10*3/uL — AB (ref 150–400)
RBC: 1.15 MIL/uL — ABNORMAL LOW (ref 3.87–5.11)
RDW: 17.3 % — ABNORMAL HIGH (ref 11.5–15.5)
WBC: 0.4 10*3/uL — AB (ref 4.0–10.5)
nRBC: 11.4 % — ABNORMAL HIGH (ref 0.0–0.2)

## 2018-01-02 LAB — I-STAT TROPONIN, ED: Troponin i, poc: 5.57 ng/mL (ref 0.00–0.08)

## 2018-01-02 LAB — PROCALCITONIN: Procalcitonin: 61.17 ng/mL

## 2018-01-02 LAB — BASIC METABOLIC PANEL
ANION GAP: 8 (ref 5–15)
BUN: 43 mg/dL — ABNORMAL HIGH (ref 8–23)
CO2: 21 mmol/L — ABNORMAL LOW (ref 22–32)
Calcium: 7.4 mg/dL — ABNORMAL LOW (ref 8.9–10.3)
Chloride: 103 mmol/L (ref 98–111)
Creatinine, Ser: 2.02 mg/dL — ABNORMAL HIGH (ref 0.44–1.00)
GFR calc non Af Amer: 26 mL/min — ABNORMAL LOW (ref >60)
GFR, EST AFRICAN AMERICAN: 30 mL/min — AB (ref >60)
Glucose, Bld: 169 mg/dL — ABNORMAL HIGH (ref 70–99)
POTASSIUM: 3 mmol/L — AB (ref 3.5–5.1)
Sodium: 132 mmol/L — ABNORMAL LOW (ref 135–145)

## 2018-01-02 LAB — PREPARE RBC (CROSSMATCH)

## 2018-01-02 LAB — APTT: APTT: 36 s (ref 24–36)

## 2018-01-02 LAB — MAGNESIUM: Magnesium: 2 mg/dL (ref 1.7–2.4)

## 2018-01-02 LAB — ABO/RH: ABO/RH(D): O POS

## 2018-01-02 LAB — MRSA PCR SCREENING: MRSA by PCR: POSITIVE — AB

## 2018-01-02 LAB — TROPONIN I: Troponin I: 4.57 ng/mL (ref <0.03)

## 2018-01-02 LAB — LACTIC ACID, PLASMA: Lactic Acid, Venous: 2.2 mmol/L (ref 0.5–1.9)

## 2018-01-02 MED ORDER — FLUCONAZOLE IN SODIUM CHLORIDE 400-0.9 MG/200ML-% IV SOLN
400.0000 mg | INTRAVENOUS | Status: DC
  Administered 2018-01-02: 400 mg via INTRAVENOUS
  Filled 2018-01-02: qty 200

## 2018-01-02 MED ORDER — CHLORHEXIDINE GLUCONATE 0.12 % MT SOLN
15.0000 mL | Freq: Two times a day (BID) | OROMUCOSAL | Status: DC
  Administered 2018-01-02 – 2018-01-04 (×5): 15 mL via OROMUCOSAL
  Filled 2018-01-02 (×5): qty 15

## 2018-01-02 MED ORDER — SODIUM CHLORIDE 0.9 % IV BOLUS
500.0000 mL | Freq: Once | INTRAVENOUS | Status: AC
  Administered 2018-01-02: 500 mL via INTRAVENOUS

## 2018-01-02 MED ORDER — ORAL CARE MOUTH RINSE
15.0000 mL | Freq: Two times a day (BID) | OROMUCOSAL | Status: DC
  Administered 2018-01-03 – 2018-01-04 (×4): 15 mL via OROMUCOSAL

## 2018-01-02 MED ORDER — SODIUM CHLORIDE 0.9 % IV SOLN
2.0000 g | Freq: Once | INTRAVENOUS | Status: AC
  Administered 2018-01-02: 2 g via INTRAVENOUS
  Filled 2018-01-02 (×4): qty 2

## 2018-01-02 MED ORDER — CHLORHEXIDINE GLUCONATE CLOTH 2 % EX PADS
6.0000 | MEDICATED_PAD | Freq: Every day | CUTANEOUS | Status: DC
  Administered 2018-01-03 – 2018-01-04 (×2): 6 via TOPICAL

## 2018-01-02 MED ORDER — POTASSIUM CHLORIDE 10 MEQ/50ML IV SOLN
10.0000 meq | INTRAVENOUS | Status: AC
  Administered 2018-01-02 – 2018-01-03 (×3): 10 meq via INTRAVENOUS
  Filled 2018-01-02 (×3): qty 50

## 2018-01-02 MED ORDER — SODIUM CHLORIDE 0.9 % IV SOLN
1.0000 g | Freq: Three times a day (TID) | INTRAVENOUS | Status: DC
  Filled 2018-01-02: qty 1

## 2018-01-02 MED ORDER — SODIUM CHLORIDE 0.9 % IV BOLUS (SEPSIS)
1000.0000 mL | Freq: Once | INTRAVENOUS | Status: AC
  Administered 2018-01-02: 1000 mL via INTRAVENOUS

## 2018-01-02 MED ORDER — SODIUM CHLORIDE 0.9 % IV SOLN
2.0000 g | Freq: Once | INTRAVENOUS | Status: AC
  Administered 2018-01-02: 2 g via INTRAVENOUS
  Filled 2018-01-02: qty 2

## 2018-01-02 MED ORDER — SODIUM CHLORIDE 0.9 % IV SOLN
INTRAVENOUS | Status: DC
  Administered 2018-01-02 (×2): via INTRAVENOUS

## 2018-01-02 MED ORDER — MUPIROCIN 2 % EX OINT
1.0000 "application " | TOPICAL_OINTMENT | Freq: Two times a day (BID) | CUTANEOUS | Status: DC
  Administered 2018-01-03 – 2018-01-04 (×5): 1 via NASAL
  Filled 2018-01-02 (×3): qty 22

## 2018-01-02 MED ORDER — SODIUM CHLORIDE 0.9 % IV BOLUS (SEPSIS)
250.0000 mL | Freq: Once | INTRAVENOUS | Status: AC
  Administered 2018-01-02: 250 mL via INTRAVENOUS

## 2018-01-02 MED ORDER — SODIUM CHLORIDE 0.9% IV SOLUTION
Freq: Once | INTRAVENOUS | Status: AC
  Administered 2018-01-02: 17:00:00 via INTRAVENOUS

## 2018-01-02 MED ORDER — ONDANSETRON HCL 4 MG/2ML IJ SOLN: 4.0000 mg | Freq: Four times a day (QID) | INTRAMUSCULAR | Status: DC | PRN

## 2018-01-02 MED ORDER — PANTOPRAZOLE SODIUM 40 MG IV SOLR
40.0000 mg | Freq: Every day | INTRAVENOUS | Status: DC
  Administered 2018-01-02 – 2018-01-04 (×3): 40 mg via INTRAVENOUS
  Filled 2018-01-02 (×3): qty 40

## 2018-01-02 MED ORDER — VANCOMYCIN HCL IN DEXTROSE 750-5 MG/150ML-% IV SOLN
750.0000 mg | INTRAVENOUS | Status: DC
  Administered 2018-01-03: 750 mg via INTRAVENOUS
  Filled 2018-01-02: qty 150

## 2018-01-02 MED ORDER — VANCOMYCIN HCL IN DEXTROSE 1-5 GM/200ML-% IV SOLN
1000.0000 mg | Freq: Once | INTRAVENOUS | Status: AC
  Administered 2018-01-02: 1000 mg via INTRAVENOUS
  Filled 2018-01-02: qty 200

## 2018-01-02 MED ORDER — SODIUM CHLORIDE 0.9 % IV SOLN
1.0000 g | Freq: Two times a day (BID) | INTRAVENOUS | Status: DC
  Administered 2018-01-03: 1 g via INTRAVENOUS
  Filled 2018-01-02: qty 1

## 2018-01-02 MED ORDER — METRONIDAZOLE IN NACL 5-0.79 MG/ML-% IV SOLN
500.0000 mg | Freq: Three times a day (TID) | INTRAVENOUS | Status: DC
  Administered 2018-01-02: 500 mg via INTRAVENOUS
  Filled 2018-01-02: qty 100

## 2018-01-02 NOTE — ED Notes (Signed)
Dr.Ray made aware of pt lactic Acid and Troponin level.ED-Lab.

## 2018-01-02 NOTE — ED Notes (Signed)
[phone] Kassandra Meriweather is patients daughter.

## 2018-01-02 NOTE — Progress Notes (Signed)
Pharmacy Antibiotic Note  [name] is a 62 y.o. female admitted on 01/11/2018 s/p fall.  Pharmacy has been consulted for vancomycin and cefepime dosing for sepsis.  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  First doses of antibiotics are already ordered.  SCr 2.48 (BL SCr 0.8), CrCL 21 ml/min, afebrile, WBC 1.15, LA 7.66 > 3.91.  Plan: Vanc 1gm IV x 1, then 750mg  IV Q24H Cefepime 2gm IV x 1, then 1gm IV Q24H Flagyl 500mg  IV Q8H per MD Monitor renal fxn, clinical progress, vanc trough as indicated  Height: 5\' 2"  (157.5 cm) Weight: 142 lb 3.2 oz (64.5 kg) IBW/kg (Calculated) : 50.1  Temp (24hrs), Avg:95.8 F (35.4 C), Min:93.8 F (34.3 C), Max:97.7 F (36.5 C)  Recent Labs  Lab 01/15/2018 1247 01/14/2018 1255 01/05/2018 1441  WBC PENDING  --   --   CREATININE 2.48*  --   --   LATICACIDVEN  --  7.66* 3.91*    Estimated Creatinine Clearance: 20.8 mL/min (A) (by C-G formula based on SCr of 2.48 mg/dL (H)).    No Known Allergies   Vanc 12/1 >> Cefepime 12/1 >> Flagyl 12/1 >>  12/1 BCx -    Dywane Peruski D. Mina Marble, PharmD, BCPS, [place] 01/08/2018, 3:10 PM

## 2018-01-02 NOTE — ED Triage Notes (Signed)
Pt to ER from home for evaluation after unwitnessed fall sometime over the night, found on the floor this morning. Initially hypotensive with EMS in the 70's, patient is very hard of hearing with hearing aid present to right ear. Patient alert on arrival and communicative, mottled. Rectal temp 93.8

## 2018-01-02 NOTE — Progress Notes (Addendum)
eLink Physician-Brief Progress Note Patient Name: SHAWANDA SIEVERT DOB: [birth year] MRN: [id number]   Date of Service  01/11/2018  HPI/Events of Note  Nursing request to review labs  eICU Interventions  Labs reviewed. Troponin ordered. Hemoglobin pending completion of third unit of PRBC.        Kerry Kass Galdino Hinchman 01/13/2018, 7:49 PM

## 2018-01-02 NOTE — Progress Notes (Signed)
CRITICAL VALUE ALERT  Critical Value:  Plt 27     Trop 4.57  Date & Time Notied: 01/08/2018 2100

## 2018-01-02 NOTE — H&P (Addendum)
NAME:  [name], MRN:  [id number], DOB:  02-17-1955, LOS: 0 ADMISSION DATE:  01/30/2018, CONSULTATION DATE:  12/1 REFERRING MD:  Jeanell Sparrow, CHIEF COMPLAINT:  Circulatory shock   Brief History   62 year old female w/ large B cell Lymphoma last chemo completed 23rd. Admitted after being found on floor at home. In ER hypotensive. Hgb < 4. Working dx: sepsis vs GIB  History of present illness   62 year old female w/ multiple co-morbids as listed below. Just completed last cycle of Chemo 11/20. Had been instructed to take Etoposide 3 tabs instead of 5 (but she took all 5). Did c/o watery diarrhea and sores on her mouth  starting  11/29. Presented via EMS after being found on floor at home after an unwitnessed fall. On EMS arrival SBP 70s. 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  Significant Hospital Events   12/1 admitted w/ working dx of sepsis, colitis/ GIB   Consults:  Oncology   Procedures:    Significant Diagnostic Tests:    Micro Data:  Blood cultures x 2 12/1>>> UC 12/1>>>  Antimicrobials:  Vanc 12/1>>> meropenem 12/1>>> Voriconazole 12/1>>>   Interim history/subjective:  Getting blood  Objective   Blood pressure (Abnormal) 90/53, pulse 78, temperature (Abnormal) 93.8 F (34.3 C), temperature source Rectal, resp. rate 17, SpO2 93 %.        Intake/Output Summary (Last 24 hours) at 01/29/2018 1420 Last data filed at 01/31/2018 1347 Gross per 24 hour  Intake 500 ml  Output no documentation  Net 500 ml   There were no vitals filed for this visit.  Examination: General: frail 62 year old acute on chronically ill female. 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 Left skin tear  Neuro: awake and alert no focal def  GU: voids   Resolved Hospital Problem list     Assessment & Plan:   Circulatory shock. Likely mix of hypovolemia (hemorrhagic from possible GIB and neutropenic sepsis source not clear but )-she has received 2 liters of fluid w/ good SBP response Plan Admit to ICU Transfuse  Pan-culture  Broad spec abx (vanc/meropenem and also vori) MAP goal > 65 Repeat lactic acid  Acute/subacute blood loss anemia presume colitis (Chemo induced vs ischemic vs infective colitis) ->baseline hgb 8.5 Plan CT abd/pelvis  FOB Transfuse 3 units PRBC (goal >7) PPI Correct coags as appropriate   Pancytopenia s/p chemo Plan Neutropenic precautions Holding asa and plavix   H/o systolic CM (ef 32-95%) Plan KVO IVFs after blood Cont tele Holding antihypertensives for now   AKI Baseline cr 1.35; currently 2.48 Plan cont IV resuscitation  Strict I&O F/u chemistry in am   Anion gap metabolic acidosis/lactic acidosis  Plan Cont resuscitation as above Repeat chemistry  Treat shock  Fluid and electrolyte imbalance: hypokalemia, hyponatremia  Plan Replace K Repeat as indicated  Large B cell Lymphoma  -last chemo was 11/20 and 11/23 Plan F/u [facility]    Best practice:  Diet: NPO Pain/Anxiety/Delirium protocol (if indicated): na VAP protocol (if indicated): na DVT prophylaxis:  SCD GI prophylaxis: PPI Glucose control: ssi  Mobility: bedrest Code Status: full code  Family Communication: daughter updated  Disposition: critically ill treating for life threatening infection + pancytopenia w/ possible GIB   Labs   CBC: No results for input(s): WBC, NEUTROABS, HGB, HCT, MCV, PLT in the last 168 hours.  Basic Metabolic Panel: No results for input(s): NA, K, CL, CO2, GLUCOSE, BUN, CREATININE, CALCIUM, MG, PHOS in the last 168 hours. GFR: CrCl cannot  be calculated (Patient's most recent lab result is older than the maximum 21 days allowed.). Recent Labs  Lab 01/03/2018 1255  LATICACIDVEN 7.66*    Liver Function Tests: No results for input(s): AST, ALT, ALKPHOS, BILITOT, PROT, ALBUMIN in the last 168 hours. No results for input(s): LIPASE, AMYLASE in the last 168 hours. No results for input(s): AMMONIA in the last 168 hours.  ABG No results found for: PHART, PCO2ART, PO2ART, HCO3, TCO2, ACIDBASEDEF, O2SAT   Coagulation Profile: No results for input(s): INR, PROTIME in the last 168 hours.  Cardiac Enzymes: No results for input(s): CKTOTAL, CKMB, CKMBINDEX, TROPONINI in the last 168 hours.  HbA1C: Hgb A1c MFr Bld  Date/Time Value Ref Range Status  03/15/2007 03:40 AM   Final   5.8 (NOTE)   The ADA recommends the following therapeutic goals for glycemic   control related to Hgb A1C measurement:   Goal of Therapy:   < 7.0% Hgb A1C   Action Suggested:  > 8.0% Hgb A1C   Ref:  Diabetes Care, 22, Suppl. 1, 1999    CBG: No results for input(s): GLUCAP in the last 168 hours.  Review of Systems:   Na   Past Medical History  She,  has a past medical history of Ankle fracture, Dementia ([place]), History of blood transfusion, Hypertension, Left-sided weakness, and Stroke ([place]) (2009).   Surgical History    Past Surgical History:  Procedure Laterality Date  . CATARACT EXTRACTION, BILATERAL    . HARDWARE REMOVAL Right 08/26/2016   Procedure: RIGHT ANKLE HARDWARE REMOVAL AND DEBRIDEMENT OF BONE;  Surgeon: Leandrew Koyanagi, MD;  Location: [place];  Service: Orthopedics;  Laterality: Right;  . ORIF ANKLE FRACTURE Right 04/15/2016   Procedure: OPEN REDUCTION INTERNAL FIXATION (ORIF) RIGHT LATERAL MALLEOLUS AND SYNDESMOSIS;  Surgeon: Leandrew Koyanagi, MD;  Location: [place];  Service: Orthopedics;  Laterality: Right;  . TUBAL LIGATION       Social History   reports that she has quit smoking. She has never used smokeless tobacco. She reports that she  does not drink alcohol or use drugs.   Family History   Her family history is not on file. She was adopted.   Allergies No Known Allergies   Home Medications  Prior to Admission medications   Medication Sig Start Date End Date Taking? Authorizing Provider  amLODipine (NORVASC) 2.5 MG tablet Take 2.5 mg by mouth daily.    [provider]  aspirin EC 325 MG tablet Take 1 tablet (325 mg total) by mouth daily. 04/15/16   Leandrew Koyanagi, MD  atorvastatin (LIPITOR) 20 MG tablet Take 20 mg by mouth daily.    [provider]  chlorthalidone (HYGROTON) 25 MG tablet Take 25 mg by mouth daily.    [provider]  clopidogrel (PLAVIX) 75 MG tablet Take 75 mg by mouth daily.    [provider]  donepezil (ARICEPT) 5 MG tablet Take 5 mg by mouth at bedtime.    [provider]  HYDROcodone-acetaminophen [facility]) [phone]  MG tablet Take 1 tablet by mouth every 6 (six) hours as needed. 04/02/16   Leandrew Koyanagi, MD  HYDROcodone-acetaminophen (NORCO) 5-325 MG tablet Take 1 tablet by mouth every 6 (six) hours as needed. 08/26/16   Leandrew Koyanagi, MD  methocarbamol (ROBAXIN) 750 MG tablet Take 1 tablet (750 mg total) by mouth 2 (two) times daily as needed for muscle spasms. 04/15/16   Leandrew Koyanagi, MD  ondansetron (ZOFRAN) 4 MG tablet Take 1-2 tablets (4-8 mg total) by mouth every 8 (eight) hours as needed for nausea or vomiting. 04/15/16   Leandrew Koyanagi, MD  potassium chloride SA (K-DUR,KLOR-CON) 20 MEQ tablet Take 20 mEq by mouth 2 (two) times daily.    [provider]  promethazine (PHENERGAN) 25 MG tablet Take 1 tablet (25 mg total) by mouth every 6 (six) hours as needed for nausea. Patient not taking: Reported on 07/06/2016 04/15/16   Leandrew Koyanagi, MD  senna-docusate (SENOKOT S) 8.6-50 MG tablet Take 1 tablet by mouth at bedtime as needed. Patient not taking: Reported on 07/06/2016 04/15/16   Leandrew Koyanagi, MD     Critical care time: 66 minutes    Erick Colace  ACNP-BC [place] Pager # [phone] OR # [phone] if no answer  Attending Note:  62 year old female with diffuse large B-cell lymphoma who had neutropenia and a slow GI bleed with Hg of 3.3 but normotensive.   On exam, the patient is alert and interactive, moving all ext to commands with clear lungs.  I reviewed CXR myself, no acute disease noted.  Discussed with PCCM-NP.  Will admit to the ICU for observation.  H/O consult called.  Transfuse 3 units pRBC slowly.  Oncology to determine if neupogen is needed.  Start cefepime, vanc and vori.  Pan culture.  PCCM will continue to follow.  The patient is critically ill with multiple organ systems failure and requires high complexity decision making for assessment and support, frequent evaluation and titration of therapies, application of advanced monitoring technologies and extensive interpretation of multiple databases.   Critical Care Time devoted to patient care services described in this note is  45  Minutes. This time reflects time of care of this signee Dr Jennet Maduro. This critical care time does not reflect procedure time, or teaching time or supervisory time of PA/NP/Med student/Med Resident etc but could involve care discussion time.  Rush Farmer, M.D. [facility] Pulmonary/Critical Care Medicine. Pager: [phone]. After hours pager: [phone].

## 2018-01-02 NOTE — Progress Notes (Addendum)
Pharmacy Antibiotic Note  [name] is a 62 y.o. female admitted on 01/04/2018 s/p fall.  Pharmacy has been consulted for vancomycin and cefepime dosing for sepsis.  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  CCM has decided to change her coverage to vanc/merrem/voriconazole. She has renal impairment already. Will ask CCM about using fluconazole instead. Dr. Nelda Marseille is ok with the fluconazole plan.  SCr 2.48 (BL SCr 0.8), CrCL 21 ml/min, afebrile, WBC 1.15, LA 7.66 > 3.91.   Plan: Vanc 1gm IV x 1, then 750mg  IV Q24H Merrem 2g IV x1 then 1g IV q12 Fluconazole 400mg  IV q24   Height: 5\' 2"  (157.5 cm) Weight: 142 lb 3.2 oz (64.5 kg) IBW/kg (Calculated) : 50.1  Temp (24hrs), Avg:95.8 F (35.4 C), Min:93.8 F (34.3 C), Max:97.7 F (36.5 C)  Recent Labs  Lab 01/29/2018 1247 01/12/2018 1255 01/19/2018 1441  WBC 0.4*  --   --   CREATININE 2.48*  --   --   LATICACIDVEN  --  7.66* 3.91*    Estimated Creatinine Clearance: 20.8 mL/min (A) (by C-G formula based on SCr of 2.48 mg/dL (H)).    No Known Allergies   Vanc 12/1 >> Cefepime 12/1 >>12/1 Flagyl 12/1 >>12/1 Merrem 12/1>> Fluconazole 12/1>>   12/1 BCx -  12/1 Urine>>  Onnie Boer, PharmD, BCIDP, AAHIVP, CPP Infectious Disease Pharmacist 01/18/2018 4:24 PM

## 2018-01-02 NOTE — ED Provider Notes (Signed)
[place] EMERGENCY DEPARTMENT Provider Note   CSN: [id number] Arrival date & time: 01/21/2018  1133     History   Chief Complaint Chief Complaint  Patient presents with  . Fall    HPI [name] is a 62 y.o. female. Level 5 caveat secondary to patient being deaf and history of dementia HPI 62 year old female history of left-sided weakness, with recent chemo for lymphoma presents today with reports that she fell.  Per EMS family last saw her last night at 830.  They found her this morning on the ground.  Chart review of records [facility] 12/22/2017 States diffuse large cell B-cell lymphoma involving the right breast.  Given history of cardiomyopathy, patient had non-anthracycline-containing regimen.  They recommend therapy every 21 days for 6 cycles.  She has completed 4 cycles of treatment and tolerated well.  Interim scans after C3 revealed complete response of known disease in the breast  And referral to radiation oncology for consideration after completion of chemotherapy area.  Plan referral to [facility] Dandy at [facility] Past Medical History:  Diagnosis Date  . Ankle fracture   . Dementia ([place])   . History of blood transfusion   . Hypertension   . Left-sided weakness    post stroke  . Stroke [facility]) 2009   "6 light" "2 Major"- "I take medication for memory"    Patient Active Problem List   Diagnosis Date Noted  . Displaced fracture of lateral malleolus of right fibula, subsequent encounter for closed fracture with routine healing     Past Surgical History:  Procedure Laterality Date  . CATARACT EXTRACTION, BILATERAL    . HARDWARE REMOVAL Right 08/26/2016   Procedure: RIGHT ANKLE HARDWARE REMOVAL AND DEBRIDEMENT OF BONE;  Surgeon: Leandrew Koyanagi, MD;  Location: [place];  Service: Orthopedics;  Laterality: Right;  . ORIF ANKLE FRACTURE Right 04/15/2016   Procedure: OPEN REDUCTION INTERNAL FIXATION (ORIF) RIGHT LATERAL MALLEOLUS  AND SYNDESMOSIS;  Surgeon: Leandrew Koyanagi, MD;  Location: [place];  Service: Orthopedics;  Laterality: Right;  . TUBAL LIGATION       OB History   None      Home Medications    Prior to Admission medications   Medication Sig Start Date End Date Taking? Authorizing Provider  amLODipine (NORVASC) 2.5 MG tablet Take 2.5 mg by mouth daily.    [provider]  aspirin EC 325 MG tablet Take 1 tablet (325 mg total) by mouth daily. 04/15/16   Leandrew Koyanagi, MD  atorvastatin (LIPITOR) 20 MG tablet Take 20 mg by mouth daily.    [provider]  chlorthalidone (HYGROTON) 25 MG tablet Take 25 mg by mouth daily.    [provider]  clopidogrel (PLAVIX) 75 MG tablet Take 75 mg by mouth daily.    [provider]  donepezil (ARICEPT) 5 MG tablet Take 5 mg by mouth at bedtime.    [provider]  HYDROcodone-acetaminophen (NORCO) 5-325 MG tablet Take 1 tablet by mouth every 6 (six) hours as needed. 04/02/16   Leandrew Koyanagi, MD  HYDROcodone-acetaminophen (NORCO) 5-325 MG tablet Take 1 tablet by mouth every 6 (six) hours as needed. 08/26/16   Leandrew Koyanagi, MD  methocarbamol (ROBAXIN) 750 MG tablet Take 1 tablet (750 mg total) by mouth 2 (two) times daily as needed for muscle spasms. 04/15/16   Leandrew Koyanagi, MD  ondansetron (ZOFRAN) 4 MG tablet Take 1-2 tablets (4-8 mg total) by  mouth every 8 (eight) hours as needed for nausea or vomiting. 04/15/16   Leandrew Koyanagi, MD  potassium chloride SA (K-DUR,KLOR-CON) 20 MEQ tablet Take 20 mEq by mouth 2 (two) times daily.    [provider]  promethazine (PHENERGAN) 25 MG tablet Take 1 tablet (25 mg total) by mouth every 6 (six) hours as needed for nausea. Patient not taking: Reported on 07/06/2016 04/15/16   Leandrew Koyanagi, MD  senna-docusate (SENOKOT S) 8.6-50 MG tablet Take 1 tablet by mouth at bedtime as needed. Patient not taking: Reported on 07/06/2016 04/15/16   Leandrew Koyanagi, MD    Family History Family History    Adopted: Yes    Social History Social History   Tobacco Use  . Smoking status: Former Research scientist (life sciences)  . Smokeless tobacco: Never Used  . Tobacco comment: quit in 1990's  Substance Use Topics  . Alcohol use: No  . Drug use: No     Allergies   Patient has no known allergies.   Review of Systems Review of Systems   Physical Exam Updated Vital Signs BP (!) 96/31 (BP Location: Right Arm)   Pulse 78   Temp (!) 93.8 F (34.3 C) (Rectal)   Resp 20   SpO2 93%   Physical Exam  Constitutional: She appears well-developed and well-nourished. No distress.  Pale, frail, patient appears older than stated age Patient is deaf and communication is extremely difficult  HENT:  Head: Normocephalic and atraumatic.  Right Ear: External ear normal.  Left Ear: External ear normal.  Mucous membranes are dry  Eyes:  Conjunctive a are pale  Neck: Normal range of motion. Neck supple.  Cardiovascular: Normal rate and regular rhythm.  Pulmonary/Chest: Effort normal and breath sounds normal.  Abdominal: Soft. Bowel sounds are normal.  Chest wall with port  Musculoskeletal: Normal range of motion.  Neurological: She is alert.  Skin: Capillary refill takes 2 to 3 seconds. There is pallor.  Cool, pale, and   Psychiatric: She has a normal mood and affect.  Nursing note and vitals reviewed.    ED Treatments / Results  Labs (all labs ordered are listed, but only abnormal results are displayed) Labs Reviewed  CULTURE, BLOOD (ROUTINE X 2)  CULTURE, BLOOD (ROUTINE X 2)  CBC WITH DIFFERENTIAL/PLATELET  COMPREHENSIVE METABOLIC PANEL  URINALYSIS, ROUTINE W REFLEX MICROSCOPIC  I-STAT CG4 LACTIC ACID, ED  I-STAT TROPONIN, ED    EKG EKG Interpretation  Date/Time:  Sunday January 02 2018 11:44:50 EST Ventricular Rate:  77 PR Interval:    QRS Duration: 99 QT Interval:  418 QTC Calculation: 474 R Axis:   73 Text Interpretation:  Normal sinus rhythm Poor data quality Confirmed by Pattricia Boss [phone]) on 01/07/2018 12:10:27 PM   Radiology Dg Pelvis 1-2 Views  Result Date: 01/05/2018 CLINICAL DATA:  Fall EXAM: PELVIS - 1-2 VIEW COMPARISON:  None. FINDINGS: There is no evidence of pelvic fracture or diastasis. No pelvic bone lesions are seen. IMPRESSION: Negative. Electronically Signed   By: Franchot Gallo M.D.   On: 01/26/2018 13:01   Dg Chest Port 1 View  Result Date: 01/07/2018 CLINICAL DATA:  Fall.  History of lymphoma EXAM: PORTABLE CHEST 1 VIEW COMPARISON:  Chest two-view 03/15/2007 FINDINGS: Heart size mildly enlarged. Negative for heart failure. Atherosclerotic aortic arch. Lungs are clear without infiltrate or effusion. Port-A-Cath tip in the SVC. IMPRESSION: No active disease. Electronically Signed   By: Franchot Gallo M.D.   On: 01/05/2018 13:00   Dg  Knee Left Port  Result Date: 01/21/2018 CLINICAL DATA:  Fall EXAM: PORTABLE LEFT KNEE - 1-2 VIEW COMPARISON:  None. FINDINGS: No evidence of fracture, dislocation, or joint effusion. No evidence of arthropathy or other focal bone abnormality. Soft tissues are unremarkable. IMPRESSION: Negative. Electronically Signed   By: Franchot Gallo M.D.   On: 01/05/2018 13:01   Dg Knee Right Port  Result Date: 01/13/2018 CLINICAL DATA:  Fall EXAM: PORTABLE RIGHT KNEE - 1-2 VIEW COMPARISON:  None. FINDINGS: No evidence of fracture, dislocation, or joint effusion. No evidence of arthropathy or other focal bone abnormality. Soft tissues are unremarkable. IMPRESSION: Negative. Electronically Signed   By: Franchot Gallo M.D.   On: 01/16/2018 13:06    Procedures .Critical Care Performed by: Pattricia Boss, MD Authorized by: Pattricia Boss, MD   Critical care provider statement:    Critical care time (minutes):  65   Critical care end time:  01/31/2018 2:46 PM   Critical care was necessary to treat or prevent imminent or life-threatening deterioration of the following conditions:  Cardiac failure, circulatory failure, dehydration and  sepsis   Critical care was time spent personally by me on the following activities:  Discussions with consultants, evaluation of patient's response to treatment, examination of patient, ordering and performing treatments and interventions, ordering and review of laboratory studies, ordering and review of radiographic studies, pulse oximetry, re-evaluation of patient's condition, obtaining history from patient or surrogate and review of old charts   (including critical care time)  Medications Ordered in ED Medications  sodium chloride 0.9 % bolus 500 mL (has no administration in time range)     Initial Impression / Assessment and Plan / ED Course  I have reviewed the triage vital signs and the nursing notes.  Pertinent labs & imaging results that were available during my care of the patient were reviewed by me and considered in my medical decision making (see chart for details).    Hypothermia noted Blood cultures obtained Plan broad spectrum abx given ho recent chemo, weakness, and hypothermia  12:55 PM Are at bedside. Patient hypotensive 84/25 Fluids infusing Discussed chemo and complaints with daughter.  Daughter states that she has been having diarrhea that began on Friday.  She has had multiple loose stools daily.  She took her last chemo on the 22nd and is scheduled to have chemo again on the 11th.  1:09 PM Patient with fluids infusing. Blood pressure 90/40 Lactic elevated at 7 and troponin at 5  Called lab and noted that hemoglobin is low at 3.4 with white blood cell count 400 Emergency release blood ordered Discussed with critical care, Dr. Nelda Marseille, and Marni Griffon.  They will assume care  Patient was consistent with sepsis due to chemotherapy induced neutropenia with associated severe anemia.  Platelet count is still pending. Blood cultures obtained Broad-spectrum antibiotics given Urinalysis pending Sepsis - Repeat Assessment  Performed at:    2:50  PM   Vitals     Blood pressure (!) 90/53, pulse 78, temperature (!) 93.8 F (34.3 C), temperature source Rectal, resp. rate 17, SpO2 93 %.  Heart:     Regular rate and rhythm  Lungs:    CTA  Capillary Refill:   > 2 sec  Peripheral Pulse:   Radial pulse palpable  Skin:     Pale, Mottled and Dry    Final Clinical Impressions(s) / ED Diagnoses   Final diagnoses:  Sepsis with acute renal failure, due to unspecified organism, unspecified acute renal failure  type, unspecified whether septic shock present ([place])  Chemotherapy-induced neutropenia ([place])  Anemia, unspecified type    ED Discharge Orders    None       Pattricia Boss, MD 01/23/2018 1451

## 2018-01-02 NOTE — Progress Notes (Signed)
eLink Physician-Brief Progress Note Patient Name: [name] DOB: [birth year] MRN: [id number]   Date of Service  01/24/2018  HPI/Events of Note  Hypokalemia  eICU Interventions  Modified Elink electrolyte replacement orders for K+ placed (Pt has AKI)        [name] 01/29/2018, 10:14 PM

## 2018-01-02 NOTE — ED Notes (Signed)
Advised by CCM to infuse each unit of blood in one hour.

## 2018-01-02 NOTE — ED Notes (Addendum)
Patient getting Blood at this time, unable to draw labs

## 2018-01-03 ENCOUNTER — Inpatient Hospital Stay (HOSPITAL_COMMUNITY): Payer: Medicare Other

## 2018-01-03 ENCOUNTER — Telehealth: Payer: Self-pay | Admitting: Radiation Oncology

## 2018-01-03 ENCOUNTER — Other Ambulatory Visit: Payer: Self-pay

## 2018-01-03 DIAGNOSIS — D696 Thrombocytopenia, unspecified: Secondary | ICD-10-CM

## 2018-01-03 DIAGNOSIS — I639 Cerebral infarction, unspecified: Secondary | ICD-10-CM

## 2018-01-03 DIAGNOSIS — R5081 Fever presenting with conditions classified elsewhere: Secondary | ICD-10-CM

## 2018-01-03 DIAGNOSIS — I519 Heart disease, unspecified: Secondary | ICD-10-CM

## 2018-01-03 DIAGNOSIS — Z87891 Personal history of nicotine dependence: Secondary | ICD-10-CM

## 2018-01-03 DIAGNOSIS — I959 Hypotension, unspecified: Secondary | ICD-10-CM

## 2018-01-03 DIAGNOSIS — I5020 Unspecified systolic (congestive) heart failure: Secondary | ICD-10-CM

## 2018-01-03 DIAGNOSIS — K1231 Oral mucositis (ulcerative) due to antineoplastic therapy: Secondary | ICD-10-CM

## 2018-01-03 DIAGNOSIS — I110 Hypertensive heart disease with heart failure: Secondary | ICD-10-CM

## 2018-01-03 DIAGNOSIS — D649 Anemia, unspecified: Secondary | ICD-10-CM

## 2018-01-03 DIAGNOSIS — Z95828 Presence of other vascular implants and grafts: Secondary | ICD-10-CM

## 2018-01-03 DIAGNOSIS — R1310 Dysphagia, unspecified: Secondary | ICD-10-CM

## 2018-01-03 DIAGNOSIS — R0902 Hypoxemia: Secondary | ICD-10-CM

## 2018-01-03 DIAGNOSIS — I214 Non-ST elevation (NSTEMI) myocardial infarction: Secondary | ICD-10-CM

## 2018-01-03 DIAGNOSIS — D701 Agranulocytosis secondary to cancer chemotherapy: Secondary | ICD-10-CM

## 2018-01-03 DIAGNOSIS — C8330 Diffuse large B-cell lymphoma, unspecified site: Secondary | ICD-10-CM

## 2018-01-03 DIAGNOSIS — R7989 Other specified abnormal findings of blood chemistry: Secondary | ICD-10-CM

## 2018-01-03 DIAGNOSIS — Z9889 Other specified postprocedural states: Secondary | ICD-10-CM

## 2018-01-03 DIAGNOSIS — D709 Neutropenia, unspecified: Secondary | ICD-10-CM

## 2018-01-03 DIAGNOSIS — I69351 Hemiplegia and hemiparesis following cerebral infarction affecting right dominant side: Secondary | ICD-10-CM

## 2018-01-03 DIAGNOSIS — T451X5A Adverse effect of antineoplastic and immunosuppressive drugs, initial encounter: Secondary | ICD-10-CM

## 2018-01-03 DIAGNOSIS — H9192 Unspecified hearing loss, left ear: Secondary | ICD-10-CM

## 2018-01-03 DIAGNOSIS — Z9221 Personal history of antineoplastic chemotherapy: Secondary | ICD-10-CM

## 2018-01-03 LAB — CBC
HCT: 26.5 % — ABNORMAL LOW (ref 36.0–46.0)
Hemoglobin: 8.7 g/dL — ABNORMAL LOW (ref 12.0–15.0)
MCH: 29.3 pg (ref 26.0–34.0)
MCHC: 32.8 g/dL (ref 30.0–36.0)
MCV: 89.2 fL (ref 80.0–100.0)
Platelets: 25 10*3/uL — CL (ref 150–400)
RBC: 2.97 MIL/uL — ABNORMAL LOW (ref 3.87–5.11)
RDW: 17.2 % — ABNORMAL HIGH (ref 11.5–15.5)
WBC: 0.6 10*3/uL — CL (ref 4.0–10.5)
nRBC: 4.8 % — ABNORMAL HIGH (ref 0.0–0.2)

## 2018-01-03 LAB — POCT I-STAT 3, ART BLOOD GAS (G3+)
Acid-base deficit: 9 mmol/L — ABNORMAL HIGH (ref 0.0–2.0)
Bicarbonate: 13.2 mmol/L — ABNORMAL LOW (ref 20.0–28.0)
O2 Saturation: 96 %
TCO2: 14 mmol/L — ABNORMAL LOW (ref 22–32)
pCO2 arterial: 21.1 mmHg — ABNORMAL LOW (ref 32.0–48.0)
pH, Arterial: 7.403 (ref 7.350–7.450)
pO2, Arterial: 78 mmHg — ABNORMAL LOW (ref 83.0–108.0)

## 2018-01-03 LAB — TROPONIN I
TROPONIN I: 7.13 ng/mL — AB (ref <0.03)
Troponin I: 7.18 ng/mL (ref <0.03)
Troponin I: 6.72 ng/mL (ref <0.03)
Troponin I: 4.81 ng/mL (ref <0.03)
Troponin I: 2.51 ng/mL (ref <0.03)

## 2018-01-03 LAB — BASIC METABOLIC PANEL
Anion gap: 10 (ref 5–15)
BUN: 42 mg/dL — AB (ref 8–23)
CO2: 20 mmol/L — ABNORMAL LOW (ref 22–32)
Calcium: 7.9 mg/dL — ABNORMAL LOW (ref 8.9–10.3)
Chloride: 107 mmol/L (ref 98–111)
Creatinine, Ser: 1.84 mg/dL — ABNORMAL HIGH (ref 0.44–1.00)
GFR calc Af Amer: 33 mL/min — ABNORMAL LOW (ref >60)
GFR calc non Af Amer: 29 mL/min — ABNORMAL LOW (ref >60)
Glucose, Bld: 117 mg/dL — ABNORMAL HIGH (ref 70–99)
Potassium: 2.9 mmol/L — ABNORMAL LOW (ref 3.5–5.1)
Sodium: 137 mmol/L (ref 135–145)

## 2018-01-03 LAB — URINE CULTURE: Culture: NO GROWTH

## 2018-01-03 LAB — LACTIC ACID, PLASMA: LACTIC ACID, VENOUS: 1.4 mmol/L (ref 0.5–1.9)

## 2018-01-03 LAB — HEMOGLOBIN AND HEMATOCRIT, BLOOD
HCT: 27.9 % — ABNORMAL LOW (ref 36.0–46.0)
HCT: 29.1 % — ABNORMAL LOW (ref 36.0–46.0)
HEMOGLOBIN: 8.7 g/dL — AB (ref 12.0–15.0)
Hemoglobin: 9.4 g/dL — ABNORMAL LOW (ref 12.0–15.0)

## 2018-01-03 LAB — PATHOLOGIST SMEAR REVIEW

## 2018-01-03 LAB — BLOOD PRODUCT ORDER (VERBAL) VERIFICATION

## 2018-01-03 LAB — HIV ANTIBODY (ROUTINE TESTING W REFLEX): HIV Screen 4th Generation wRfx: NONREACTIVE

## 2018-01-03 LAB — GLUCOSE, CAPILLARY: GLUCOSE-CAPILLARY: 170 mg/dL — AB (ref 70–99)

## 2018-01-03 LAB — PHOSPHORUS: Phosphorus: 3.4 mg/dL (ref 2.5–4.6)

## 2018-01-03 LAB — MAGNESIUM: Magnesium: 2 mg/dL (ref 1.7–2.4)

## 2018-01-03 MED ORDER — WHITE PETROLATUM EX OINT
TOPICAL_OINTMENT | CUTANEOUS | Status: AC
  Administered 2018-01-03: 0.2
  Filled 2018-01-03: qty 28.35

## 2018-01-03 MED ORDER — STROKE: EARLY STAGES OF RECOVERY BOOK
Freq: Once | Status: AC
  Administered 2018-01-03: 23:00:00
  Filled 2018-01-03: qty 1

## 2018-01-03 MED ORDER — FUROSEMIDE 10 MG/ML IJ SOLN
40.0000 mg | Freq: Once | INTRAMUSCULAR | Status: AC
  Administered 2018-01-03: 40 mg via INTRAVENOUS
  Filled 2018-01-03: qty 4

## 2018-01-03 MED ORDER — ACYCLOVIR 200 MG/5ML PO SUSP
400.0000 mg | Freq: Three times a day (TID) | ORAL | Status: DC
  Administered 2018-01-03 (×2): 400 mg via ORAL
  Filled 2018-01-03 (×4): qty 10

## 2018-01-03 MED ORDER — SODIUM CHLORIDE 0.9 % IV SOLN
2.0000 g | INTRAVENOUS | Status: DC
  Administered 2018-01-03: 2 g via INTRAVENOUS
  Filled 2018-01-03 (×2): qty 2

## 2018-01-03 MED ORDER — SODIUM CHLORIDE 0.9 % IV SOLN
INTRAVENOUS | Status: DC
  Administered 2018-01-03: 20:00:00 via INTRAVENOUS

## 2018-01-03 MED ORDER — TBO-FILGRASTIM 480 MCG/0.8ML ~~LOC~~ SOSY
480.0000 ug | PREFILLED_SYRINGE | Freq: Every day | SUBCUTANEOUS | Status: DC
  Administered 2018-01-03: 480 ug via SUBCUTANEOUS
  Filled 2018-01-03 (×2): qty 0.8

## 2018-01-03 MED ORDER — POTASSIUM CHLORIDE 10 MEQ/50ML IV SOLN
10.0000 meq | INTRAVENOUS | Status: AC
  Administered 2018-01-03 (×4): 10 meq via INTRAVENOUS
  Filled 2018-01-03 (×4): qty 50

## 2018-01-03 MED ORDER — HYDRALAZINE HCL 20 MG/ML IJ SOLN
20.0000 mg | INTRAMUSCULAR | Status: DC | PRN
  Administered 2018-01-03: 20 mg via INTRAVENOUS
  Filled 2018-01-03: qty 1

## 2018-01-03 NOTE — Progress Notes (Signed)
After walking into room, noticed left facial droop, difficulty communicating, worse slurred speech, left pupil enlarged, notified charge RN, NIH 6 @ 1844. Code stroke called. After code stroke team arrival, transferred pt to head CT. Transferred back to [place], RN

## 2018-01-03 NOTE — Progress Notes (Signed)
NAME:  [name], MRN:  [id number], DOB:  29-Sep-1955, LOS: 1 ADMISSION DATE:  01/28/2018, CONSULTATION DATE:  12/1 REFERRING MD:  Jeanell Sparrow, CHIEF COMPLAINT:  Circulatory shock   Brief History   62 year old female w/ large B cell Lymphoma last chemo completed 23rd. Admitted after being found on floor at home. In ER hypotensive. Hgb < 4. Working dx: sepsis vs GIB  History of present illness   62 year old female w/ multiple co-morbids as listed below. Just completed last cycle of Chemo 11/20. Had been instructed to take Etoposide 3 tabs instead of 5 (but she took all 5). Did c/o watery diarrhea and sores on her mouth  starting  11/29. Presented via EMS after being found on floor at home after an unwitnessed fall. On EMS arrival SBP 70s. 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   Significant Hospital Events   12/1 admitted w/ working dx of sepsis, colitis/ GIB   Consults:  Oncology  ID  Procedures:  None  Significant Diagnostic Tests:    Micro Data:  Blood cultures x 2 12/1>>> UC 12/1>>>  Antimicrobials:  Vanc 12/1>>> meropenem 12/1>>> Fluconazole 12/1>>>  Interim history/subjective:  Urinary retention overnight requiring I/O cath Troponins positive and rising  Objective   Blood pressure 130/75, pulse 95, temperature 99 F (37.2 C), temperature source Oral, resp. rate (!) 24, height 5\' 4"  (1.626 m), weight 64.8 kg, SpO2 96 %.        Intake/Output Summary (Last 24 hours) at 01/03/2018 0710 Last data filed at 01/03/2018 0600 Gross per 24 hour  Intake 4803.32 ml  Output 850 ml  Net 3953.32 ml   Filed Weights   01/27/2018 1500 01/24/2018 1650 01/03/18 0400  Weight: 64.5 kg 62.4 kg 64.8 kg    Examination: General: Frail chronically ill  appearing female, NAD HENT: [place]/AT, PERRL, EOM-I and MMM Lungs: CTA bilaterally Cardiovascular: RRR, Nl S1/S2 and -M/R/G Abdomen: Soft, NT, ND and +BS Extremities: multiple areas of ecchymosis. Left skin tear  Neuro: Awake and interactive, hard of hearing but moving all ext to command GU: voids  Skin: left skin tear as above  I reviewed chest/abd/pelvic CT myself, no acute findings, mass unchanged  Resolved Hospital Problem list     Assessment & Plan:   Circulatory shock. Likely mix of hypovolemia (hemorrhagic from possible GIB and neutropenic sepsis source not clear but )-she has received 2 liters of fluid w/ good SBP response Plan Hold in the ICU given rising troponins Hold further transfusions at this point F/U on cultures Broad spec abx (vanc/meropenem and also fluconazole) ID consult for neutropenic fever MAP goal > 65  Positive troponin: likely due to severe anemia Plan: Repeat troponin EKG Cardiology consult called Keep in the ICU for closer monitoring  Acute/subacute blood loss anemia presume colitis (Chemo induced vs ischemic vs infective colitis) ->baseline hgb 8.5 Plan CT abd/pelvis noted, no acute finding FOB H&H q8 x3 more rounds now that is more stable PPI Correct coags as appropriate   Pancytopenia s/p chemo Plan Neutropenic precautions Holding asa and plavix  Oncology consult to be called today  H/o systolic CM (ef 09-98%) Plan KVO IVFs after blood Cont tele Holding antihypertensives for now  AKI Baseline cr 1.35; currently 2.48 Plan Continue IVF resuscitation Strict I&O F/u chemistry in am  Replace electrolytes as indicated (K replaced by elink)  Anion gap metabolic acidosis/lactic acidosis  Plan Cont resuscitation as above Repeat chemistry   Fluid and electrolyte imbalance: hypokalemia, hyponatremia  Plan Repeat as indicated BMET in AM  Large B cell Lymphoma  -last chemo was 11/20 and 11/23 Plan F/u [facility] Will ask oncology  to evaluate today if neupogen is appropriate  Discussed with cardiology, H/O and ID.  Best practice:  Diet: NPO Pain/Anxiety/Delirium protocol (if indicated): na VAP protocol (if indicated): na DVT prophylaxis: SCD GI prophylaxis: PPI Glucose control: ssi  Mobility: bedrest Code Status: full code  Family Communication: daughter updated  Disposition: critically ill treating for life threatening infection + pancytopenia w/ possible GIB   Labs   CBC: Recent Labs  Lab 01/14/2018 1247 01/06/2018 2049  WBC 0.4* 0.3*  HGB 3.3* 8.8*  HCT 11.2* 27.2*  MCV 97.4 90.4  PLT 32* 27*    Basic Metabolic Panel: Recent Labs  Lab 01/27/2018 1247 01/14/2018 1748  NA 130* 132*  K 3.1* 3.0*  CL 98 103  CO2 14* 21*  GLUCOSE 212* 169*  BUN 43* 43*  CREATININE 2.48* 2.02*  CALCIUM 8.2* 7.4*  MG  --  2.0   GFR: Estimated Creatinine Clearance: 24.9 mL/min (A) (by C-G formula based on SCr of 2.02 mg/dL (H)). Recent Labs  Lab 01/29/2018 1247 01/18/2018 1255 01/24/2018 1441 01/21/2018 1748 01/16/2018 1749 01/08/2018 2049  PROCALCITON  --   --   --  61.17  --   --   WBC 0.4*  --   --   --   --  0.3*  LATICACIDVEN  --  7.66* 3.91*  --  2.2*  --     Liver Function Tests: Recent Labs  Lab 01/12/2018 1247  AST 63*  ALT 33  ALKPHOS 44  BILITOT 1.8*  PROT 5.7*  ALBUMIN 2.1*   No results for input(s): LIPASE, AMYLASE in the last 168 hours. No results for input(s): AMMONIA in the last 168 hours.  ABG    Component Value Date/Time   PHART 7.403 01/03/2018 0444   PCO2ART 21.1 (L) 01/03/2018 0444   PO2ART 78.0 (L) 01/03/2018 0444   HCO3 13.2 (L) 01/03/2018 0444   TCO2 14 (L) 01/03/2018 0444   ACIDBASEDEF 9.0 (H) 01/03/2018 0444   O2SAT 96.0 01/03/2018 0444     Coagulation Profile: Recent Labs  Lab 01/07/2018 1748  INR 1.46    Cardiac Enzymes: Recent Labs  Lab 01/05/2018 2049  TROPONINI 4.57*    HbA1C: Hgb A1c MFr Bld  Date/Time Value Ref Range Status  03/15/2007 03:40 AM   Final    5.8 (NOTE)   The ADA recommends the following therapeutic goals for glycemic   control related to Hgb A1C measurement:   Goal of Therapy:   < 7.0% Hgb A1C   Action Suggested:  > 8.0% Hgb A1C   Ref:  Diabetes Care, 22, Suppl. 1, 1999    CBG: Recent Labs  Lab 01/30/2018 1701  GLUCAP 140*   The patient is critically ill with multiple organ systems failure and requires high complexity decision making for assessment and support, frequent evaluation and titration of therapies, application of advanced monitoring technologies and extensive interpretation of multiple databases.   Critical Care Time devoted to patient care services described in this note is  35  Minutes. This time reflects time of care of this signee  Dr Jennet Maduro. This critical care time does not reflect procedure time, or teaching time or supervisory time of PA/NP/Med student/Med Resident etc but could involve care discussion time.  Rush Farmer, M.D. [facility] Pulmonary/Critical Care Medicine. Pager: [phone]. After hours pager: [phone].

## 2018-01-03 NOTE — Progress Notes (Signed)
eLink Physician-Brief Progress Note Patient Name: [name] DOB: [birth year] MRN: [id number]   Date of Service  01/03/2018  HPI/Events of Note  Urinary retention  eICU Interventions  In/ out bladder catheterization        Frederik Pear 01/03/2018, 5:32 AM

## 2018-01-03 NOTE — Consult Note (Addendum)
Neurology Consultation  Reason for Consult: code stroke Referring Physician: Dr Nelda Marseille - PCCM  CC: Left facial droop, slurred speech  History is obtained from: Chart review, patient started bedside, ICU RNs.  HPI: [name] is a 62 y.o. female past medical history of multiple strokes in the past with documented residual left-sided weakness, and off and on left facial droop and the notes from [facility], history of diffuse large B-cell lymphoma involving the breast status post 5 cycles of R-CEOP and possibly taken additional chemotherapy doses causing neutropenia and currently admitted for neutropenic sepsis, last known normal at 6:40 PM on 01/03/2018 when it was noted that her speech is more slurred and the left facial droop more prominent. A code stroke was called because of the acute neurological status change. Daughter was not able to provide much history.  She said that the patient has had multiple strokes some major some minor with some residual left-sided weakness. Patient was evaluated at bedside exam documented below. Not a candidate for IV TPA due to a platelet count of 25 and possible GI bleeding. No signs of LVO clinically and poor baseline modified Rankin precludes intervention.  LKW: 6:40 PM 01/03/2018 tpa given?: no, platelet count of 25,000 Premorbid modified Rankin scale (mRS): 4   ROS: unable to obtain due to altered mental status.   Past Medical History:  Diagnosis Date  . Ankle fracture   . Dementia ([place])   . History of blood transfusion   . Hypertension   . Left-sided weakness    post stroke  . Stroke [facility]) 2009   "6 light" "2 Major"- "I take medication for memory"    Family History  Adopted: Yes   Social History:   reports that she has quit smoking. She has never used smokeless tobacco. She reports that she does not drink alcohol or use drugs.  Medications  Current Facility-Administered Medications:  .  0.9 %  sodium chloride infusion, , Intravenous,  Continuous, Erick Colace, NP, Stopped at 01/03/18 1417 .  0.9 %  sodium chloride infusion, , Intravenous, Continuous, Aroor, Karena Addison R, MD .  acyclovir (ZOVIRAX) 200 MG/5ML suspension SUSP 400 mg, 400 mg, Oral, TID, Campbell Riches, MD, 400 mg at 01/03/18 1538 .  ceFEPIme (MAXIPIME) 2 g in sodium chloride 0.9 % 100 mL IVPB, 2 g, Intravenous, Q24H, Campbell Riches, MD, Last Rate: 200 mL/hr at 01/03/18 1657, 2 g at 01/03/18 1657 .  chlorhexidine (PERIDEX) 0.12 % solution 15 mL, 15 mL, Mouth Rinse, BID, Rush Farmer, MD, 15 mL at 01/03/18 1028 .  Chlorhexidine Gluconate Cloth 2 % PADS 6 each, 6 each, Topical, Q0600, Rush Farmer, MD, 6 each at 01/03/18 1029 .  MEDLINE mouth rinse, 15 mL, Mouth Rinse, q12n4p, Rush Farmer, MD, 15 mL at 01/03/18 1534 .  mupirocin ointment (BACTROBAN) 2 % 1 application, 1 application, Nasal, BID, Rush Farmer, MD, 1 application at 54/00/86 1027 .  ondansetron (ZOFRAN) injection 4 mg, 4 mg, Intravenous, Q6H PRN, Erick Colace, NP .  pantoprazole (PROTONIX) injection 40 mg, 40 mg, Intravenous, QHS, Erick Colace, NP, 40 mg at 01/17/2018 2107 .  Tbo-Filgrastim (GRANIX) injection 480 mcg, 480 mcg, Subcutaneous, q1800, Lindi Adie, Vinay, MD .  vancomycin (VANCOCIN) IVPB 750 mg/150 ml premix, 750 mg, Intravenous, Q24H, Erick Colace, NP, Last Rate: 150 mL/hr at 01/03/18 1600, 750 mg at 01/03/18 1600  Exam: Current vital signs: BP (!) 149/86   Pulse (!) 122   Temp  98.2 F (36.8 C) (Oral)   Resp (!) 21   Ht 5\' 4"  (1.626 m)   Wt 64.8 kg   SpO2 95%   BMI 24.52 kg/m  Vital signs in last 24 hours: Temp:  [97.9 F (36.6 C)-99 F (37.2 C)] 98.2 F (36.8 C) (12/02 1851) Pulse Rate:  [95-125] 122 (12/02 1851) Resp:  [19-31] 21 (12/02 1851) BP: (115-163)/(67-96) 149/86 (12/02 1845) SpO2:  [88 %-100 %] 95 % (12/02 1851) Weight:  [64.8 kg] 64.8 kg (12/02 0400) General: Awake, alert, in mild respiratory distress HEENT: A conjunctival pallor, clear  sclera, clear nares with extremely dry crusted mouth. CVS: Tachycardic, S1-S2 heard Respiratory: Increased work of breathing, clear to auscultation Extremities: Warm well perfused Neurological exam Patient is awake, alert, able to follow some commands by mimicking.  She is able to tell me her name-[name] but unable to follow other commands-unclear if it is due to hearing loss or if there is a comprehension issue. At baseline very hard of hearing and has slurred speech. Cranial nerves: Pupils equal round reactive to light, extra ocular movements intact, tends to both sides and blinks to threat from both sides, left lower facial weakness noted on exam, severely diminished auditory acuity bilaterally. Motor exam: Is able to lift all 4 extremities antigravity without vertical drift. Sensory exam: Grimaces to noxious stim elation all over Coordination: Difficult to follow commands due to hard of hearing but no gross ataxia appreciated.  NIHSS 1a Level of Conscious.: 0 1b LOC Questions: 2 1c LOC Commands:0  2 Best Gaze: 0 3 Visual: 0 4 Facial Palsy: 2 5a Motor Arm - left: 0 5b Motor Arm - Right: 0 6a Motor Leg - Left: 0 6b Motor Leg - Right: 0 7 Limb Ataxia: 0 8 Sensory: 0 9 Best Language: 1 10 Dysarthria: 2 11 Extinct. and Inatten.: 0 TOTAL: 7    Labs I have reviewed labs in epic and the results pertinent to this consultation are: WBC count 0.6, hemoglobin 9.4, hematocrit 29.1, platelet count 25,000. Potassium 2.9, creatinine 1.84 with a GFR of 29, total bilirubin 1.8.  Next CBC    Component Value Date/Time   WBC 0.6 (LL) 01/03/2018 0650   RBC 2.97 (L) 01/03/2018 0650   HGB 9.4 (L) 01/03/2018 1652   HGB 12.8 04/08/2007 1344   HCT 29.1 (L) 01/03/2018 1652   HCT 37.3 04/08/2007 1344   PLT 25 (LL) 01/03/2018 0650   PLT 256 04/08/2007 1344   MCV 89.2 01/03/2018 0650   MCV 86.2 04/08/2007 1344   MCH 29.3 01/03/2018 0650   MCHC 32.8 01/03/2018 0650   RDW 17.2 (H) 01/03/2018 0650    RDW 13.1 04/08/2007 1344   LYMPHSABS 2.8 10/17/2008 2014   LYMPHSABS 3.4 (H) 04/08/2007 1344   MONOABS 1.0 10/17/2008 2014   MONOABS 1.0 (H) 04/08/2007 1344   EOSABS 0.2 10/17/2008 2014   EOSABS 0.7 (H) 04/08/2007 1344   BASOSABS 0.0 10/17/2008 2014   BASOSABS 0.0 04/08/2007 1344    CMP     Component Value Date/Time   NA 137 01/03/2018 0650   K 2.9 (L) 01/03/2018 0650   CL 107 01/03/2018 0650   CO2 20 (L) 01/03/2018 0650   GLUCOSE 117 (H) 01/03/2018 0650   BUN 42 (H) 01/03/2018 0650   CREATININE 1.84 (H) 01/03/2018 0650   CALCIUM 7.9 (L) 01/03/2018 0650   PROT 5.7 (L) 01/26/2018 1247   ALBUMIN 2.1 (L) 01/12/2018 1247   AST 63 (H) 01/15/2018 1247   ALT  33 01/07/2018 1247   ALKPHOS 44 01/17/2018 1247   BILITOT 1.8 (H) 01/31/2018 1247   GFRNONAA 29 (L) 01/03/2018 0650   GFRAA 33 (L) 01/03/2018 0650   Imaging I have reviewed the images obtained:  CT-scan of the brain-no acute changes.  Densely calcified vertebrobasilar system.  No bleed.  Stable chronic bilateral basal ganglia infarcts.  Assessment:  62 year old woman past history of multiple strokes in the past with some residual left-sided weakness and some left facial droop that acutely worsened and a code stroke was called. She also has a history of diffuse large B-cell lymphoma involving the breast status post 5 cycles of radiation and chemotherapy. She is currently admitted with neutropenic sepsis with last known normal 6:40 PM. NIH stroke scale is 7 due to facial droop, dysarthria and LOC questions/best language-with the caveat that she is extremely hard of hearing. Not a candidate for IV TPA-thrombocytopenia. Not a candidate for intervention- no clear signs of LVO on exam, poor baseline modified Rankin score.  Impression: Acute ischemic stroke-likely secondary to hypotension causing watershed strokes versus hypercoagulable due to cancer. Recrudescence of old stroke symptoms in the setting of acute  illness.  Recommendations: Continue telemetry Frequent neurochecks MRI of the brain without contrast when able to. MRA head and neck without contrast Hemoglobin C Fasting lipid panel 2D echocardiogram Aspirin when okay with critical care/GI. Physical therapy Outpatient therapy Speech therapy N.p.o. until cleared by speech evaluation Stroke service will continue to follow with you.  -- Amie Portland, MD [facility] Pager: [phone] If 7pm to 7am, please call on call as listed on AMION.

## 2018-01-03 NOTE — Progress Notes (Signed)
Evaluate the patient patient is tachypneic using accessory muscles including neck and abdomen.  Patient is on nasal cannula saturating 100%. Decreased air sound bilaterally.  Patient has left facial weakness power is otherwise 5/5.  Discussed with the family and patient patient does not want to be resuscitated We will get a chest x-ray to be followed Observe closely for respiratory status We appreciate neurology input

## 2018-01-03 NOTE — Telephone Encounter (Signed)
Daughter, Kourtni, called stating that her mom, the pt wouldn't be able to make tomorrow's consult appt and does not want to reschedule at this time. I have let Dr. Isidore Moos know.

## 2018-01-03 NOTE — Consult Note (Addendum)
 Cardiology Consultation:   Patient ID: [name] MRN: [id number]; DOB: 1955-07-29  Admit date: 01/20/2018 Date of Consult: 01/03/2018  Primary Care Provider: Hayden Rasmussen, MD Primary Cardiologist: Dr. Beau Fanny, [facility] Primary Electrophysiologist:  None   Patient Profile:   [name] is a 62 y.o. 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 Nelda Marseille, Pulmonary and Critical Care Medicine.   History of Present Illness:   [name] is followed by cardiology at [facility]. She is followed by Dr. Conni Slipper. [name] was referred to cardiology for new systolic HF this past summer. 2D echo 09/2017 showed reduced LVEF at 40% with LAD territory akinesis. Per cardiology consult note 09/10/2017, "suspect silent MI and undiagnosed LAD infarct". [name] denied any CP or dyspnea. No cardiotoxic chemo agents. [name] was started on medical therapy w/ an ACEi and  blocker with plans to ultimately repeat echo to see if any improvement in LV function.   As outlined above, she also has large B cell Lymphoma and completed chemo 12/25/17. She also has h/o HTN and prior CVA. Little is known about her family history as she was adopted.   [name] presented to [facility] on 01/16/2018 after being found on the floor at home. Daughter found her on the floor by her bedside, but [name] was responsive. [name] denied any CP or dyspnea, but recalls feeling dizzy prior to her fall. [name]'s daughter also notes there was diarrhea on the floor. [name] has had diarrhea x 3 days. Watery loose stools, green and dark brown, nearly black.   In the ED, she was hypotensive w/ SBPs in the 70s and severely anemic w/ Hgb 3.3. Core temp 93.8, lactic acid 7.66. She was admitted by Critical Care with  working diagnosis of sepsis, colitis/ GIB. She received blood transfusion x 3 units and IVFs. On antibiotics. Infectious disease following. Initial troponin yesterday was 5.57. Level peaked to 7.18. Last troponin earlier today showing downward trend at 6.72. EKG shows NSR with nonspecific ST and T wave abnormality. As noted above she denies any h/o CP or dyspnea.    Past Medical History:  Diagnosis Date  . Ankle fracture   . Dementia ([place])   . History of blood transfusion   . Hypertension   . Left-sided weakness    post stroke  . Stroke [facility]) 2009   "6 light" "2 Major"- "I take medication for memory"    Past Surgical History:  Procedure Laterality Date  . CATARACT EXTRACTION, BILATERAL    . HARDWARE REMOVAL Right 08/26/2016   Procedure: RIGHT ANKLE HARDWARE REMOVAL AND DEBRIDEMENT OF BONE;  Surgeon: Leandrew Koyanagi, MD;  Location: [place];  Service: Orthopedics;  Laterality: Right;  . ORIF ANKLE FRACTURE Right 04/15/2016   Procedure: OPEN REDUCTION INTERNAL FIXATION (ORIF) RIGHT LATERAL MALLEOLUS AND SYNDESMOSIS;  Surgeon: Leandrew Koyanagi, MD;  Location: [place];  Service: Orthopedics;  Laterality: Right;  . TUBAL LIGATION       Home Medications:  Prior to Admission medications   Medication Sig Start Date End Date Taking? Authorizing Provider  atorvastatin (LIPITOR) 40 MG tablet Take 40 mg by mouth daily.    Yes [provider]  carvedilol (COREG) 3.125 MG tablet Take 3.125 mg by mouth 2 (  two) times daily with a meal.   Yes [provider]  chlorpheniramine-HYDROcodone (TUSSIONEX PENNKINETIC ER) 10-8 MG/5ML SUER Take 5 mLs by mouth every 12 (twelve) hours as needed for cough.   Yes [provider]  clopidogrel (PLAVIX) 75 MG tablet Take 75 mg by mouth daily.   Yes [provider]  clotrimazole-betamethasone (LOTRISONE) cream Apply 1 application topically 2 (two) times daily. Apply to lips   Yes [provider]  donepezil (ARICEPT) 5 MG tablet Take 5  mg by mouth at bedtime.   Yes [provider]  FLUoxetine (PROZAC) 20 MG capsule Take 20 mg by mouth daily.   Yes [provider]  lisinopril (PRINIVIL,ZESTRIL) 10 MG tablet Take 20 mg by mouth daily.   Yes [provider]  mirtazapine (REMERON) 15 MG tablet Take 15 mg by mouth at bedtime.   Yes [provider]  spironolactone (ALDACTONE) 25 MG tablet Take 50 mg by mouth daily.   Yes [provider]  aspirin EC 325 MG tablet Take 1 tablet (325 mg total) by mouth daily. Patient not taking: Reported on  04/15/16   Leandrew Koyanagi, MD  HYDROcodone-acetaminophen (NORCO) 5-325 MG tablet Take 1 tablet by mouth every 6 (six) hours as needed. Patient not taking: Reported on 01/12/2018 04/02/16   Leandrew Koyanagi, MD  HYDROcodone-acetaminophen (NORCO) 5-325 MG tablet Take 1 tablet by mouth every 6 (six) hours as needed. Patient not taking: Reported on 01/06/2018 08/26/16   Leandrew Koyanagi, MD  methocarbamol (ROBAXIN) 750 MG tablet Take 1 tablet (750 mg total) by mouth 2 (two) times daily as needed for muscle spasms. Patient not taking: Reported on 01/26/2018 04/15/16   Leandrew Koyanagi, MD  ondansetron (ZOFRAN) 4 MG tablet Take 1-2 tablets (4-8 mg total) by mouth every 8 (eight) hours as needed for nausea or vomiting. Patient not taking: Reported on 01/24/2018 04/15/16   Leandrew Koyanagi, MD  promethazine (PHENERGAN) 25 MG tablet Take 1 tablet (25 mg total) by mouth every 6 (six) hours as needed for nausea. Patient not taking: Reported on 07/06/2016 04/15/16   Leandrew Koyanagi, MD  senna-docusate (SENOKOT S) 8.6-50 MG tablet Take 1 tablet by mouth at bedtime as needed. Patient not taking: Reported on 07/06/2016 04/15/16   Leandrew Koyanagi, MD    Inpatient Medications: Scheduled Meds: . acyclovir  400 mg Oral TID  . chlorhexidine  15 mL Mouth Rinse BID  . Chlorhexidine Gluconate Cloth  6 each Topical Q0600  . mouth rinse  15 mL Mouth Rinse q12n4p  . mupirocin ointment  1  application Nasal BID  . pantoprazole (PROTONIX) IV  40 mg Intravenous QHS   Continuous Infusions: . sodium chloride Stopped (01/03/18 1135)  . ceFEPime (MAXIPIME) IV    . potassium chloride 10 mEq (01/03/18 1417)  . vancomycin     PRN Meds: ondansetron (ZOFRAN) IV  Allergies:   No Known Allergies  Social History:   Social History   Socioeconomic History  . Marital status: Single    Spouse name: Not on file  . Number of children: Not on file  . Years of education: Not on file  . Highest education level: Not on file  Occupational History  . Not on file  Social Needs  . Financial resource strain: Not on file  . Food insecurity:    Worry: Not on file    Inability: Not on file  . Transportation needs:    Medical: Not on file  Non-medical: Not on file  Tobacco Use  . Smoking status: Former Research scientist (life sciences)  . Smokeless tobacco: Never Used  . Tobacco comment: quit in 1990's  Substance and Sexual Activity  . Alcohol use: No  . Drug use: No  . Sexual activity: Not on file  Lifestyle  . Physical activity:    Days per week: Not on file    Minutes per session: Not on file  . Stress: Not on file  Relationships  . Social connections:    Talks on phone: Not on file    Gets together: Not on file    Attends religious service: Not on file    Active member of club or organization: Not on file    Attends meetings of clubs or organizations: Not on file    Relationship status: Not on file  . Intimate partner violence:    Fear of current or ex partner: Not on file    Emotionally abused: Not on file    Physically abused: Not on file    Forced sexual activity: Not on file  Other Topics Concern  . Not on file  Social History Narrative  . Not on file    Family History:    Family History  Adopted: Yes   [name] unsure of any family h/o CAD as she was adopted.   ROS:  Please see the history of present illness.   All other ROS reviewed and negative.     Physical Exam/Data:    Vitals:   01/03/18 1000 01/03/18 1100 01/03/18 1107 01/03/18 1200  BP: (!) 141/83 (!) 163/90 (!) 143/93 (!) 148/91  Pulse: 95 (!) 103 100 (!) 101  Resp: 19 (!) 28 (!) 28 (!) 25  Temp:      TempSrc:      SpO2: 96% 93% (!) 88% 94%  Weight:      Height:        Intake/Output Summary (Last 24 hours) at 01/03/2018 1454 Last data filed at 01/03/2018 1200 Gross per 24 hour  Intake 4596.83 ml  Output 850 ml  Net 3746.83 ml   Filed Weights   01/24/2018 1500 01/08/2018 1650 01/03/18 0400  Weight: 64.5 kg 62.4 kg 64.8 kg   Body mass index is 24.52 kg/m.  General:  Middle aged female s/p chemo, hairless, mouth sores HEENT: normal Lymph: no adenopathy Neck: no JVD Endocrine:  No thryomegaly Vascular: No carotid bruits; FA pulses 2+ bilaterally without bruits  Cardiac:  normal S1, S2; RRR; no murmur  Lungs:  clear to auscultation bilaterally, no wheezing, rhonchi or rales  Abd: soft, nontender, no hepatomegaly  Ext: no edema Musculoskeletal:  No deformities, BUE and BLE strength normal and equal Skin: warm and dry  Neuro:  CNs 2-12 intact, no focal abnormalities noted Psych:  Normal affect   EKG:  The EKG was personally reviewed and demonstrates:  NSR, non specific ST abnormalities.  Telemetry:  Telemetry was personally reviewed and demonstrates:  Sinus tach  Relevant CV Studies: 2D Echo 10/27/17 FINDINGS:    LEFT VENTRICLE  The left ventricular size is normal. There is normal left ventricular wall   thickness. Left ventricular systolic function is mildly reduced. LV ejection   fraction = 40%. Left ventricular filling pattern is indeterminate. There are   regional wall motion abnormalities as specified below. There is severe   hypokinesis of the LAD territory involving the mid and apical segment(s).  -   Laboratory Data:  Chemistry Recent Labs  Lab 01/31/2018 1247 01/08/2018 1748  01/03/18 0650  NA 130* 132* 137  K 3.1* 3.0* 2.9*  CL 98 103 107  CO2 14* 21* 20*   GLUCOSE 212* 169* 117*  BUN 43* 43* 42*  CREATININE 2.48* 2.02* 1.84*  CALCIUM 8.2* 7.4* 7.9*  GFRNONAA 20* 26* 29*  GFRAA 23* 30* 33*  ANIONGAP 18* 8 10    Recent Labs  Lab 01/11/2018 1247  PROT 5.7*  ALBUMIN 2.1*  AST 63*  ALT 33  ALKPHOS 44  BILITOT 1.8*   Hematology Recent Labs  Lab 01/05/2018 1247 01/15/2018 2049 01/03/18 0650 01/03/18 0822  WBC 0.4* 0.3* 0.6*  --   RBC 1.15* 3.01* 2.97*  --   HGB 3.3* 8.8* 8.7* 8.7*  HCT 11.2* 27.2* 26.5* 27.9*  MCV 97.4 90.4 89.2  --   MCH 28.7 29.2 29.3  --   MCHC 29.5* 32.4 32.8  --   RDW 17.3* 15.6* 17.2*  --   PLT 32* 27* 25*  --    Cardiac Enzymes Recent Labs  Lab 01/08/2018 2049 01/03/18 0650 01/03/18 0822  TROPONINI 4.57* 7.18*  7.13* 6.72*    Recent Labs  Lab 01/05/2018 1253  TROPIPOC 5.57*    BNPNo results for input(s): BNP, PROBNP in the last 168 hours.  DDimer No results for input(s): DDIMER in the last 168 hours.  Radiology/Studies:  Ct Abdomen Pelvis Wo Contrast  Result Date: 01/22/2018 CLINICAL DATA:  Left-sided weakness. Recent chemotherapy for lymphoma presents today post fall. Found this morning on ground. EXAM: CT CHEST, ABDOMEN AND PELVIS WITHOUT CONTRAST TECHNIQUE: Multidetector CT imaging of the chest, abdomen and pelvis was performed following the standard protocol without IV contrast. COMPARISON:  MRI pelvis 12/27/2017 FINDINGS: CT CHEST FINDINGS Cardiovascular: Right IJ Port-A-Cath has tip in the SVC. Heart is normal size. Mild calcified plaque over the thoracic aorta. Mild calcified plaque over the left main and 3 vessel coronary arteries. Remaining vascular structures are unremarkable. Mediastinum/Nodes: No evidence of mediastinal or hilar adenopathy. Remaining mediastinal structures are unremarkable. Lungs/Pleura: Lungs are adequately inflated without focal airspace consolidation or effusion. There is minimal bibasilar dependent atelectasis. Airways are normal. Musculoskeletal: Degenerative change of  the spine. CT ABDOMEN PELVIS FINDINGS Hepatobiliary: Liver and biliary tree are normal. Gallbladder is within normal. Pancreas: Normal. Spleen: Normal. Adrenals/Urinary Tract: Adrenal glands are normal. Kidneys normal size without hydronephrosis or nephrolithiasis. Couple small calcifications over the renal hilum likely vascular. 2.2 cm cyst over the mid pole right kidney. Ureters and bladder are normal. Stomach/Bowel: Stomach and small bowel are normal. Appendix is normal. Colon is normal. Vascular/Lymphatic: Mild-to-moderate calcified plaque over the abdominal aorta. No adenopathy. Reproductive: Uterus and left ovary are normal. Solid right adnexal mass measuring 4.1 x 6.5 cm unchanged from recent MRI. Other: No free fluid or focal inflammatory change. Musculoskeletal: Degenerative change of the spine. No lytic/sclerotic lesions. IMPRESSION: No acute findings in the chest, abdomen or pelvis. Known solid right adnexal mass measuring 4.1 x 6.5 cm unchanged from recent MRI and likely neoplastic in nature. No adenopathy. Continue to recommend surgical consultation. 2.2 cm right renal cyst. Aortic Atherosclerosis (ICD10-I70.0). Atherosclerotic coronary artery disease. Electronically Signed   By: Marin Olp M.D.   On:  16:55   Dg Pelvis 1-2 Views  Result Date: 01/08/2018 CLINICAL DATA:  Fall EXAM: PELVIS - 1-2 VIEW COMPARISON:  None. FINDINGS: There is no evidence of pelvic fracture or diastasis. No pelvic bone lesions are seen. IMPRESSION: Negative. Electronically Signed   By: Franchot Gallo M.D.   On: 01/22/2018  13:01   Ct Head Wo Contrast  Result Date: 01/23/2018 CLINICAL DATA:  Left-sided weakness with recent fall history of lymphoma EXAM: CT HEAD WITHOUT CONTRAST CT CERVICAL SPINE WITHOUT CONTRAST TECHNIQUE: Multidetector CT imaging of the head and cervical spine was performed following the standard protocol without intravenous contrast. Multiplanar CT image reconstructions of the cervical  spine were also generated. COMPARISON:  MRI 03/14/2007 FINDINGS: CT HEAD FINDINGS Brain: No acute territorial infarction, hemorrhage or intracranial mass. Mild atrophy. Mild small vessel ischemic changes of the white matter. Chronic lacunar infarct in the left basal ganglia. Stable ventricle size. Vascular: No hyperdense vessels.  Carotid vascular calcification Skull: Normal. Negative for fracture or focal lesion. Sinuses/Orbits: No acute orbital abnormality. Moderate mucosal opacification of the ethmoid sinuses. Complete opacification of right maxillary sinus with small air bubbles. Mucosal thickening left maxillary sinus. Extension of opacifications into the right nasal passage. Other: None CT CERVICAL SPINE FINDINGS Alignment: Straightening of the cervical spine. No subluxation. Facet alignment within normal limits. Skull base and vertebrae: No acute fracture. No primary bone lesion or focal pathologic process. Soft tissues and spinal canal: No prevertebral fluid or swelling. No visible canal hematoma. Disc levels:  Mild degenerative changes at C4-C5 and C5-C6. Upper chest: Negative. Other: None IMPRESSION: 1. No CT evidence for acute intracranial abnormality. Atrophy and small vessel ischemic changes of the white matter. Chronic lacunar infarct in the left basal ganglia 2. No acute osseous abnormality of the cervical spine 3. Paranasal sinus disease Electronically Signed   By: Donavan Foil M.D.   On: 01/08/2018 16:52   Ct Chest Wo Contrast  Result Date: 01/15/2018 CLINICAL DATA:  Left-sided weakness. Recent chemotherapy for lymphoma presents today post fall. Found this morning on ground. EXAM: CT CHEST, ABDOMEN AND PELVIS WITHOUT CONTRAST TECHNIQUE: Multidetector CT imaging of the chest, abdomen and pelvis was performed following the standard protocol without IV contrast. COMPARISON:  MRI pelvis 12/27/2017 FINDINGS: CT CHEST FINDINGS Cardiovascular: Right IJ Port-A-Cath has tip in the SVC. Heart is normal  size. Mild calcified plaque over the thoracic aorta. Mild calcified plaque over the left main and 3 vessel coronary arteries. Remaining vascular structures are unremarkable. Mediastinum/Nodes: No evidence of mediastinal or hilar adenopathy. Remaining mediastinal structures are unremarkable. Lungs/Pleura: Lungs are adequately inflated without focal airspace consolidation or effusion. There is minimal bibasilar dependent atelectasis. Airways are normal. Musculoskeletal: Degenerative change of the spine. CT ABDOMEN PELVIS FINDINGS Hepatobiliary: Liver and biliary tree are normal. Gallbladder is within normal. Pancreas: Normal. Spleen: Normal. Adrenals/Urinary Tract: Adrenal glands are normal. Kidneys normal size without hydronephrosis or nephrolithiasis. Couple small calcifications over the renal hilum likely vascular. 2.2 cm cyst over the mid pole right kidney. Ureters and bladder are normal. Stomach/Bowel: Stomach and small bowel are normal. Appendix is normal. Colon is normal. Vascular/Lymphatic: Mild-to-moderate calcified plaque over the abdominal aorta. No adenopathy. Reproductive: Uterus and left ovary are normal. Solid right adnexal mass measuring 4.1 x 6.5 cm unchanged from recent MRI. Other: No free fluid or focal inflammatory change. Musculoskeletal: Degenerative change of the spine. No lytic/sclerotic lesions. IMPRESSION: No acute findings in the chest, abdomen or pelvis. Known solid right adnexal mass measuring 4.1 x 6.5 cm unchanged from recent MRI and likely neoplastic in nature. No adenopathy. Continue to recommend surgical consultation. 2.2 cm right renal cyst. Aortic Atherosclerosis (ICD10-I70.0). Atherosclerotic coronary artery disease. Electronically Signed   By: Marin Olp M.D.   On: 01/24/2018 16:55   Ct Cervical Spine Wo Contrast  Result Date: 01/08/2018 CLINICAL  DATA:  Left-sided weakness with recent fall history of lymphoma EXAM: CT HEAD WITHOUT CONTRAST CT CERVICAL SPINE WITHOUT CONTRAST  TECHNIQUE: Multidetector CT imaging of the head and cervical spine was performed following the standard protocol without intravenous contrast. Multiplanar CT image reconstructions of the cervical spine were also generated. COMPARISON:  MRI 03/14/2007 FINDINGS: CT HEAD FINDINGS Brain: No acute territorial infarction, hemorrhage or intracranial mass. Mild atrophy. Mild small vessel ischemic changes of the white matter. Chronic lacunar infarct in the left basal ganglia. Stable ventricle size. Vascular: No hyperdense vessels.  Carotid vascular calcification Skull: Normal. Negative for fracture or focal lesion. Sinuses/Orbits: No acute orbital abnormality. Moderate mucosal opacification of the ethmoid sinuses. Complete opacification of right maxillary sinus with small air bubbles. Mucosal thickening left maxillary sinus. Extension of opacifications into the right nasal passage. Other: None CT CERVICAL SPINE FINDINGS Alignment: Straightening of the cervical spine. No subluxation. Facet alignment within normal limits. Skull base and vertebrae: No acute fracture. No primary bone lesion or focal pathologic process. Soft tissues and spinal canal: No prevertebral fluid or swelling. No visible canal hematoma. Disc levels:  Mild degenerative changes at C4-C5 and C5-C6. Upper chest: Negative. Other: None IMPRESSION: 1. No CT evidence for acute intracranial abnormality. Atrophy and small vessel ischemic changes of the white matter. Chronic lacunar infarct in the left basal ganglia 2. No acute osseous abnormality of the cervical spine 3. Paranasal sinus disease Electronically Signed   By: Donavan Foil M.D.   On: 01/26/2018 16:52   Dg Chest Port 1 View  Result Date: 01/26/2018 CLINICAL DATA:  Fall.  History of lymphoma EXAM: PORTABLE CHEST 1 VIEW COMPARISON:  Chest two-view 03/15/2007 FINDINGS: Heart size mildly enlarged. Negative for heart failure. Atherosclerotic aortic arch. Lungs are clear without infiltrate or effusion.  Port-A-Cath tip in the SVC. IMPRESSION: No active disease. Electronically Signed   By: Franchot Gallo M.D.   On: 01/13/2018 13:00   Dg Knee Left Port  Result Date: 01/16/2018 CLINICAL DATA:  Fall EXAM: PORTABLE LEFT KNEE - 1-2 VIEW COMPARISON:  None. FINDINGS: No evidence of fracture, dislocation, or joint effusion. No evidence of arthropathy or other focal bone abnormality. Soft tissues are unremarkable. IMPRESSION: Negative. Electronically Signed   By: Franchot Gallo M.D.   On: 01/25/2018 13:01   Dg Knee Right Port  Result Date: 01/08/2018 CLINICAL DATA:  Fall EXAM: PORTABLE RIGHT KNEE - 1-2 VIEW COMPARISON:  None. FINDINGS: No evidence of fracture, dislocation, or joint effusion. No evidence of arthropathy or other focal bone abnormality. Soft tissues are unremarkable. IMPRESSION: Negative. Electronically Signed   By: Franchot Gallo M.D.   On: 01/26/2018 13:06    Assessment and Plan:   [name] is a 62 y.o. 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 Nelda Marseille, Pulmonary and Critical Care Medicine.   Admit Hgb 3.3 (baseline 8.5). Critical Care suspects acute/ subacute blood loss anemia, presume colitis (Chemo induced vs ischemic vs infective colitis). She has received 3 units of blood + IVFs. Also antibiotics. Hgb now 8.7. Troponin abnormal, peaking at 7.13 and now trending downward at 6.72. EKG shows NSR with nonspecific ST abnormalities. She denies any h/o CP or dyspnea. Recent echo done 10/2017 at  [facility] showed reduced EF at 40% with severe hypokinesis of the LAD territory involving the mid and apical segments. Suspect her elevated troponin is likely type II demand ischemia, in the  setting of underlying CAD, sepsis + severe anemia/ hypovolemia. Given her multiple medical issues and anemia, she is not a candidate for invasive cardiac cath at this time. Recommend medical management for presumed CAD and continue treatment of other acute medical illnesses. Prior to admit, she was on ASA, Plavix, Coreg, Lipitor, lisinopril and spirolactone, all held on admit due to hypotension and anemia. Lipitor also held. Admit AST elevated at 63. Resume cardiac meds once more medically stable. Can resume BB given elevated BP and HR. Would continue to hold lisinopril for now given AKI. Once ok to resume antiplatelets, would recommend reducing ASA down from 325 to 81 mg. Stop Plavix.    For questions or updates, please contact [place] Please consult [email or web address] for contact info under     Signed, Lyda Jester, PA-C  01/03/2018 2:54 PM   Agree with note written by [name]  PAC  Unfortunate 62 year old female admitted on 01/15/2018 with severe hypovolemic shock secondary to profound anemia from GI bleeding.  Her hemoglobin was 3.  She is had 3 units of packed red blood cells with a hemoglobin back in the 8 range which is her baseline.  She does not have a history of B-cell lymphoma has had chemotherapy.  Her EF is been in the 40% range in the past with a focal wall motion abnormality LAD territory.  This was recognized when she was at [facility] and presumed to be a silent LAD infarct.  She denies chest pain or shortness of breath.  She does have a history of hypertension and prior CVA.  She was on high-dose aspirin and Plavix prior to admission.  Her troponin increased to the 7 range.  EKG shows no acute changes.  Her exam is benign.  I believe she had a type II non-STEMI related to hypotension and severe anemia.  Given her comorbidities I do not think an invasive work-up is appropriate at this time.  I will treat her medically.  Given her relative  tachycardia I will restart her beta-blocker as well and hold her ACE inhibitor given her renal insufficiency.   [name] 01/03/2018 4:37 PM

## 2018-01-03 NOTE — Progress Notes (Signed)
Patient noticed left facial droop, difficulty communicating, NIH 6, left pupil enlarged. Code stroke called. Jesse Sans, RN 01/03/2018 6:51 PM

## 2018-01-03 NOTE — Consult Note (Signed)
[facility] Infectious [facility]    Date of Admission:  01/20/2018   Total days of antibiotics 1        Day 1 vancomycin, meropenem, fluconazole                Reason for Consult: Neutropenic Fever     Referring Provider: Nelda Marseille  Primary Care Provider: Hayden Rasmussen, MD   Assessment: 62 y.o. immunosuppressed female with febrile neutropenia, severe anemia (hgb 3.3 on admit) and thrombocytopenia. She is improved following antibiotics, IVF and blood transfusions. CT scans reviewed and unrevealing regarding infectious source. No history of MDRs from chart review and none that family is aware of. Will change meropenmem to Cefepime; continue vancomycin. Stop fluconazole and follow fever curves over the next 48h. Consider acyclovir vs valtrex for oral lesions.   Plan: 1. Change meropenem to cefepime.   2. Continue vancomycin  3. Stop fluconazole 4. Follow micro data   Active Problems:   Neutropenic sepsis ([place])   . chlorhexidine  15 mL Mouth Rinse BID  . Chlorhexidine Gluconate Cloth  6 each Topical Q0600  . mouth rinse  15 mL Mouth Rinse q12n4p  . mupirocin ointment  1 application Nasal BID  . pantoprazole (PROTONIX) IV  40 mg Intravenous QHS    HPI: [name] is a 62 y.o. female admitted on 12/01 with shock after she was found at home on the floor. 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   Hospital Course: She was started on Vancomycin, Meropenem and Fluconazole. She is pancytopenic with hemoglobin  3.3 Plt 27 and WBC 0.6 with neuts too few to count. Temp overnight 100.3 F. Blood cultures are so far no growth. Urine cultures no growth and U/A looks OK. CT C/A/P 12/02 >> No acute findings in the chest, abdomen or pelvis. Known solid right adnexal mass measuring 4.1 x 6.5 cm unchanged from recent MRI and likely neoplastic in  nature. No adenopathy. Continue to recommend surgical consultation. CT C-Spine 12/02 >> No CT evidence for acute intracranial abnormality. Atrophy and small vessel ischemic changes of the white matter. Chronic lacunar infarct in the left basal ganglia.   Her daughter (whom is deaf and can only read lips) was upset that she may have allowed a med error to happen. Apparently was called re: her oral chemo regimen and the patient took over her prescribed dose d/t worsening kidney function. Her daughter tells me that last week she started with a runny nose Wednesday. Chart review of CareEverywhere also reveals that she had 2 days of diarrhea that was described to be watery and new onset lip ulcers. It was requested to have her come in for an appointment but not certain this had occurred.   Chest port on the right has been normal and w/o pain, tenderness or swelling. Aside from diarrhea, decreased appetite runny nose and lip ulcerations no other symptoms to report.   Review of Systems: Review of Systems  Constitutional: Positive for fever. Negative for chills, malaise/fatigue and weight loss.  HENT: Negative for sore throat.        Lip ulcerations and oral pain.  Runny nose.   Respiratory: Negative for cough, sputum production and shortness of breath.   Cardiovascular: Negative for chest pain and leg swelling.  Gastrointestinal: Negative for abdominal  pain, diarrhea and vomiting.       Decreased appetitie  Genitourinary: Negative for dysuria and flank pain.  Musculoskeletal: Negative for joint pain, myalgias and neck pain.  Skin: Negative for rash.  Neurological: Negative for dizziness, tingling and headaches.       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 The patient is not nervous/anxious and does not have insomnia.     Past Medical History:  Diagnosis Date  . Ankle fracture   . Dementia ([place])   . History of blood  transfusion   . Hypertension   . Left-sided weakness    post stroke  . Stroke [facility]) 2009   "6 light" "2 Major"- "I take medication for memory"    Social History   Tobacco Use  . Smoking status: Former Research scientist (life sciences)  . Smokeless tobacco: Never Used  . Tobacco comment: quit in 1990's  Substance Use Topics  . Alcohol use: No  . Drug use: No    Family History  Adopted: Yes   No Known Allergies  OBJECTIVE: Blood pressure 139/79, pulse 100, temperature 98.2 F (36.8 C), temperature source Oral, resp. rate (!) 27, height 5\' 4"  (1.626 m), weight 64.8 kg, SpO2 96 %.  Physical Exam  Constitutional: She is oriented to person, place, and time.  Resting in bed comfortably. Non-toxic appearing.   HENT:  Mouth/Throat: No oral lesions. No dental abscesses.  Crusted lip ulcerations to upper/lower lips. Few lesions along buccal mucosa. Tongue spared.   Cardiovascular: Normal rate, regular rhythm and normal heart sounds.  Pulmonary/Chest: Effort normal and breath sounds normal.  Abdominal: Soft. She exhibits no distension. There is no tenderness.  Musculoskeletal: Normal range of motion. She exhibits no tenderness.  Equal strength lower extremeties. Able to raise both legs up off bed.   Lymphadenopathy:    She has no cervical adenopathy.  Neurological: She is alert and oriented to person, place, and time.  Skin: Skin is warm and dry. No rash noted.  R chest port accessed and unremarkable.   Psychiatric: Judgment normal.    Lab Results Lab Results  Component Value Date   WBC 0.6 (LL) 01/03/2018   HGB 8.7 (L) 01/03/2018   HCT 27.9 (L) 01/03/2018   MCV 89.2 01/03/2018   PLT 25 (LL) 01/03/2018    Lab Results  Component Value Date   CREATININE 1.84 (H) 01/03/2018   BUN 42 (H) 01/03/2018   NA 137 01/03/2018   K 2.9 (L) 01/03/2018   CL 107 01/03/2018   CO2 20 (L) 01/03/2018    Lab Results  Component Value Date   ALT 33 01/29/2018   AST 63 (H) 01/28/2018   ALKPHOS 44 01/26/2018    BILITOT 1.8 (H) 01/06/2018     Microbiology: Recent Results (from the past 240 hour(s))  Blood culture (routine x 2)     Status: None (Preliminary result)   Collection Time: 01/25/2018 12:48 PM  Result Value Ref Range Status   Specimen Description BLOOD PORTA CATH  Final   Special Requests   Final    BOTTLES DRAWN AEROBIC AND ANAEROBIC Blood Culture adequate volume   Culture   Final    NO GROWTH < 24 HOURS Performed at [facility], [place] [address], [place], [place] [address]    Report Status PENDING  Incomplete  Blood culture (routine x 2)     Status: None (Preliminary result)   Collection Time: 01/31/2018  2:32 PM  Result Value Ref Range Status   Specimen Description BLOOD RIGHT ANTECUBITAL  Final   Special Requests   Final    BOTTLES DRAWN AEROBIC ONLY Blood Culture adequate volume   Culture   Final    NO GROWTH < 24 HOURS Performed at [facility], [address], [place], [place] [address]    Report Status PENDING  Incomplete  MRSA PCR Screening     Status: Abnormal   Collection Time: 01/19/2018  7:27 PM  Result Value Ref Range Status   MRSA by PCR POSITIVE (A) NEGATIVE Final    Comment:        The GeneXpert MRSA Assay (FDA approved for NASAL specimens only), is one component of a comprehensive MRSA colonization surveillance program. It is not intended to diagnose MRSA infection nor to guide or monitor treatment for MRSA infections. RESULT CALLED TO, READ BACK BY AND VERIFIED WITH: [place] 2344 01/03/2018 MITCHELL,L Performed at [facility], [place] [address], [place], [place] [address]     [name], MSN, NP-C [facility] for Infectious [place] Cell: [phone] Pager: [phone]  01/03/2018 9:21 AM   on

## 2018-01-03 NOTE — Consult Note (Signed)
[facility] CONSULT NOTE  Patient Care Team: Hayden Rasmussen, MD as PCP - General (Family Medicine)  CHIEF COMPLAINTS/PURPOSE OF CONSULTATION:  Neutropenic fever with sepsis  HISTORY OF PRESENTING ILLNESS:  [name] 62 y.o. female is admitted to the hospital with severe neutropenic fever with sepsis.  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 It appears that cycle 5 was given on 12/22/2017.  Patient was taking oral chemotherapy at home and mistakenly had taken additional chemotherapy doses.  EMS was called after a syncopal episode.  She had low blood pressure and hemoglobin 3.3.  She was transfused blood and was started on broad-spectrum antibiotics.  She has a history of congestive heart failure and hence she did not receive anthracycline chemotherapy.  The plan was to finish 6 cycles of chemotherapy followed by radiation to the breast.  She was also getting intrathecal methotrexate treatments.  I reviewed her records extensively and collaborated the history with the patients daughter.[name]  MEDICAL HISTORY:  Past Medical History:  Diagnosis Date  . Ankle fracture   . Dementia ([place])   . History of blood transfusion   . Hypertension   . Left-sided weakness    post stroke  . Stroke [facility]) 2009   "6 light" "2 Major"- "I take medication for memory"    SURGICAL HISTORY: Past Surgical History:  Procedure Laterality Date  . CATARACT EXTRACTION, BILATERAL    . HARDWARE REMOVAL Right 08/26/2016   Procedure: RIGHT ANKLE HARDWARE REMOVAL AND DEBRIDEMENT OF BONE;  Surgeon: Leandrew Koyanagi, MD;  Location: [facility];  Service: Orthopedics;  Laterality: Right;  . ORIF ANKLE FRACTURE Right 04/15/2016   Procedure: OPEN REDUCTION INTERNAL FIXATION (ORIF) RIGHT LATERAL MALLEOLUS AND SYNDESMOSIS;  Surgeon: Leandrew Koyanagi, MD;  Location: [facility];  Service: Orthopedics;  Laterality: Right;  . TUBAL LIGATION       SOCIAL HISTORY: Social History   Socioeconomic History  . Marital status: Single    Spouse name: Not on file  . Number of children: Not on file  . Years of education: Not on file  . Highest education level: Not on file  Occupational History  . Not on file  Social Needs  . Financial resource strain: Not on file  . Food insecurity:    Worry: Not on file    Inability: Not on file  . Transportation needs:    Medical: Not on file    Non-medical: Not on file  Tobacco Use  . Smoking status: Former Research scientist (life sciences)  . Smokeless tobacco: Never Used  . Tobacco comment: quit in 1990's  Substance and Sexual Activity  . Alcohol use: No  . Drug use: No  . Sexual activity: Not on file  Lifestyle  . Physical activity:    Days per week: Not on file    Minutes per session: Not on file  . Stress: Not on file  Relationships  . Social connections:    Talks on phone: Not on file    Gets together: Not on file    Attends religious service: Not on file    Active member of club or organization: Not on file    Attends meetings of clubs or organizations: Not on file    Relationship status: Not on file  . Intimate partner violence:    Fear of current or ex partner: Not on file    Emotionally abused: Not on file  Physically abused: Not on file    Forced sexual activity: Not on file  Other Topics Concern  . Not on file  Social History Narrative  . Not on file    FAMILY HISTORY: Family History  Adopted: Yes    ALLERGIES:  has No Known Allergies.  MEDICATIONS:  Current Facility-Administered Medications  Medication Dose Route Frequency Provider Last Rate Last Dose  . 0.9 %  sodium chloride infusion   Intravenous Continuous Erick Colace, NP   Stopped at 01/03/18 1417  . acyclovir (ZOVIRAX) 200 MG/5ML suspension SUSP 400 mg  400 mg Oral TID Campbell Riches, MD   400 mg at 01/03/18 1538  . ceFEPIme (MAXIPIME) 2 g in sodium chloride 0.9 % 100 mL IVPB  2 g Intravenous Q24H Campbell Riches, MD      . chlorhexidine (PERIDEX) 0.12 % solution 15 mL  15 mL Mouth Rinse BID Rush Farmer, MD   15 mL at 01/03/18 1028  . Chlorhexidine Gluconate Cloth 2 % PADS 6 each  6 each Topical Q0600 Rush Farmer, MD   6 each at 01/03/18 1029  . MEDLINE mouth rinse  15 mL Mouth Rinse q12n4p Rush Farmer, MD   15 mL at 01/03/18 1534  . mupirocin ointment (BACTROBAN) 2 % 1 application  1 application Nasal BID Rush Farmer, MD   1 application at 28/31/51 1027  . ondansetron (ZOFRAN) injection 4 mg  4 mg Intravenous Q6H PRN Erick Colace, NP      . pantoprazole (PROTONIX) injection 40 mg  40 mg Intravenous QHS Erick Colace, NP   40 mg at 01/15/2018 2107  . potassium chloride 10 mEq in 50 mL *CENTRAL LINE* IVPB  10 mEq Intravenous Q1 Hr x 4 Rush Farmer, MD 50 mL/hr at 01/03/18 1600    . Tbo-Filgrastim (GRANIX) injection 480 mcg  480 mcg Subcutaneous q1800 Nicholas Lose, MD      . vancomycin (VANCOCIN) IVPB 750 mg/150 ml premix  750 mg Intravenous Q24H Erick Colace, NP        REVIEW OF SYSTEMS:   Constitutional: Fevers and chills, severe fatigue Eyes: Denies blurriness of vision, double vision or watery eyes Ears, nose, mouth, throat, and face: sores on lips and bruising Respiratory: Shortness of breath at rest Cardiovascular: History of CHF Gastrointestinal: Diarrhea Skin: Denies abnormal skin rashes  Neurological: Generalized weakness, hearing impairment Behavioral/Psych: Not examined  All other systems were reviewed with the patient and are negative.  PHYSICAL EXAMINATION: ECOG PERFORMANCE STATUS: 3 - Symptomatic, >50% confined to bed  Vitals:   01/03/18 1500 01/03/18 1600  BP: (!) 144/93 (!) 146/96  Pulse: (!) 108 (!) 111  Resp: (!) 26 (!) 30  Temp: 97.9 F (36.6 C)   SpO2: 92% 91%   Filed Weights   01/26/2018 1500 01/10/2018 1650 01/03/18 0400  Weight: 142 lb 3.2 oz (64.5 kg) 137 lb 9.1 oz (62.4 kg) 142 lb 13.7 oz (64.8 kg)    GENERAL:alert, no distress  and comfortable SKIN: Bruises EYES: normal, conjunctiva are pink and non-injected, sclera clear OROPHARYNX:no exudate, no erythema and lips, buccal mucosa, and tongue normal  NECK: supple, thyroid normal size, non-tender, without nodularity LYMPH:  no palpable lymphadenopathy in the cervical, axillary or inguinal LUNGS: Clear bilaterally HEART: Tachycardia ABDOMEN:abdomen soft, non-tender and normal bowel sounds Musculoskeletal:no cyanosis of digits and no clubbing  PSYCH: Not tested NEURO: no focal motor/sensory deficits   LABORATORY DATA:  I have reviewed  the data as listed Lab Results  Component Value Date   WBC 0.6 (LL) 01/03/2018   HGB 8.7 (L) 01/03/2018   HCT 27.9 (L) 01/03/2018   MCV 89.2 01/03/2018   PLT 25 (LL) 01/03/2018   Lab Results  Component Value Date   NA 137 01/03/2018   K 2.9 (L) 01/03/2018   CL 107 01/03/2018   CO2 20 (L) 01/03/2018    RADIOGRAPHIC STUDIES: I have personally reviewed the radiological reports and agreed with the findings in the report.  ASSESSMENT AND PLAN:  1.  DLBCL: Status post 5 cycles of R- CEOP.  She received oral etoposide at home.  Apparently she took excess medication.  She also did not receive growth factor injections.  Currently admitted with neutropenic fever and sepsis.  [place] on admission was nearly 0.  The last couple of blood checks there was no differential obtained. I recommend starting Granix injections for 80 mcg daily until [employer] is greater than 1000.  2. severe anemia: Transfused hemoglobin 8.7, continue with supportive care with transfusions as needed 3.  Thrombocytopenia: Platelets 25: No obvious bleeding, transfuse platelets if patient has bleeding symptoms are below 10. 4.  History of congestive heart failure: Patient did not receive any anthracycline chemotherapy.     All questions were answered. The patient knows to call the clinic with any problems, questions or concerns.    Harriette Ohara, MD @T @

## 2018-01-03 NOTE — Progress Notes (Signed)
eLink Physician-Brief Progress Note Patient Name: [name] DOB: 1955/03/29 MRN: [id number]   Date of Service  01/03/2018  HPI/Events of Note  Called by bedside nurse who states that patient and family request all life support measures except intubation. CXR looks wet.   eICU Interventions  Will order: 1. Change code status to partial code. No intubation only.  2. Lasix 40 mg IV X 1 now.      Intervention Category Major Interventions: End of life / care limitation discussion  Lysle Dingwall 01/03/2018, 9:19 PM

## 2018-01-04 ENCOUNTER — Inpatient Hospital Stay (HOSPITAL_COMMUNITY): Payer: Medicare Other

## 2018-01-04 ENCOUNTER — Ambulatory Visit: Admission: RE | Admit: 2018-01-04 | Payer: Medicare Other | Source: Ambulatory Visit | Admitting: Radiation Oncology

## 2018-01-04 ENCOUNTER — Ambulatory Visit: Payer: Medicare Other

## 2018-01-04 DIAGNOSIS — R197 Diarrhea, unspecified: Secondary | ICD-10-CM

## 2018-01-04 DIAGNOSIS — D701 Agranulocytosis secondary to cancer chemotherapy: Secondary | ICD-10-CM

## 2018-01-04 DIAGNOSIS — R0602 Shortness of breath: Secondary | ICD-10-CM

## 2018-01-04 DIAGNOSIS — J9601 Acute respiratory failure with hypoxia: Secondary | ICD-10-CM

## 2018-01-04 DIAGNOSIS — D499 Neoplasm of unspecified behavior of unspecified site: Secondary | ICD-10-CM

## 2018-01-04 DIAGNOSIS — Z933 Colostomy status: Secondary | ICD-10-CM

## 2018-01-04 DIAGNOSIS — R6520 Severe sepsis without septic shock: Secondary | ICD-10-CM

## 2018-01-04 DIAGNOSIS — I824Z1 Acute embolism and thrombosis of unspecified deep veins of right distal lower extremity: Secondary | ICD-10-CM

## 2018-01-04 DIAGNOSIS — R4789 Other speech disturbances: Secondary | ICD-10-CM

## 2018-01-04 DIAGNOSIS — A419 Sepsis, unspecified organism: Secondary | ICD-10-CM

## 2018-01-04 DIAGNOSIS — N179 Acute kidney failure, unspecified: Secondary | ICD-10-CM

## 2018-01-04 DIAGNOSIS — T451X5A Adverse effect of antineoplastic and immunosuppressive drugs, initial encounter: Secondary | ICD-10-CM

## 2018-01-04 DIAGNOSIS — I9589 Other hypotension: Secondary | ICD-10-CM

## 2018-01-04 DIAGNOSIS — I639 Cerebral infarction, unspecified: Secondary | ICD-10-CM

## 2018-01-04 LAB — GASTROINTESTINAL PANEL BY PCR, STOOL (REPLACES STOOL CULTURE)

## 2018-01-04 LAB — CBC WITH DIFFERENTIAL/PLATELET
Abs Immature Granulocytes: 0.29 10*3/uL — ABNORMAL HIGH (ref 0.00–0.07)
Basophils Absolute: 0 10*3/uL (ref 0.0–0.1)
Basophils Relative: 0 %
EOS ABS: 0 10*3/uL (ref 0.0–0.5)
Eosinophils Relative: 0 %
HEMATOCRIT: 29.4 % — AB (ref 36.0–46.0)
Hemoglobin: 9.5 g/dL — ABNORMAL LOW (ref 12.0–15.0)
Immature Granulocytes: 9 %
Lymphocytes Relative: 4 %
Lymphs Abs: 0.1 10*3/uL — ABNORMAL LOW (ref 0.7–4.0)
MCH: 28.8 pg (ref 26.0–34.0)
MCHC: 32.3 g/dL (ref 30.0–36.0)
MCV: 89.1 fL (ref 80.0–100.0)
Monocytes Absolute: 0.4 10*3/uL (ref 0.1–1.0)
Monocytes Relative: 14 %
Neutro Abs: 2.3 10*3/uL (ref 1.7–7.7)
Neutrophils Relative %: 73 %
Platelets: 23 10*3/uL — CL (ref 150–400)
RBC: 3.3 MIL/uL — ABNORMAL LOW (ref 3.87–5.11)
RDW: 17.5 % — ABNORMAL HIGH (ref 11.5–15.5)
WBC: 3.1 10*3/uL — ABNORMAL LOW (ref 4.0–10.5)
nRBC: 1 % — ABNORMAL HIGH (ref 0.0–0.2)

## 2018-01-04 LAB — LIPID PANEL
Cholesterol: 80 mg/dL (ref 0–200)
HDL: <10 mg/dL — ABNORMAL LOW (ref >40)
Triglycerides: 153 mg/dL — ABNORMAL HIGH (ref <150)
VLDL: 31 mg/dL (ref 0–40)

## 2018-01-04 LAB — BASIC METABOLIC PANEL
Anion gap: 11 (ref 5–15)
BUN: 43 mg/dL — ABNORMAL HIGH (ref 8–23)
CO2: 16 mmol/L — ABNORMAL LOW (ref 22–32)
Calcium: 8.2 mg/dL — ABNORMAL LOW (ref 8.9–10.3)
Chloride: 112 mmol/L — ABNORMAL HIGH (ref 98–111)
Creatinine, Ser: 1.62 mg/dL — ABNORMAL HIGH (ref 0.44–1.00)
GFR calc Af Amer: 39 mL/min — ABNORMAL LOW (ref >60)
GFR, EST NON AFRICAN AMERICAN: 34 mL/min — AB (ref >60)
Glucose, Bld: 146 mg/dL — ABNORMAL HIGH (ref 70–99)
Potassium: 3 mmol/L — ABNORMAL LOW (ref 3.5–5.1)
Sodium: 139 mmol/L (ref 135–145)

## 2018-01-04 LAB — HEMOGLOBIN AND HEMATOCRIT, BLOOD
HCT: 28.9 % — ABNORMAL LOW (ref 36.0–46.0)
Hemoglobin: 9.7 g/dL — ABNORMAL LOW (ref 12.0–15.0)

## 2018-01-04 LAB — HEMOGLOBIN A1C
Hgb A1c MFr Bld: 6 % — ABNORMAL HIGH (ref 4.8–5.6)
Mean Plasma Glucose: 125.5 mg/dL

## 2018-01-04 LAB — PHOSPHORUS: Phosphorus: 2.7 mg/dL (ref 2.5–4.6)

## 2018-01-04 LAB — MAGNESIUM: MAGNESIUM: 2 mg/dL (ref 1.7–2.4)

## 2018-01-04 LAB — GLUCOSE, CAPILLARY: Glucose-Capillary: 153 mg/dL — ABNORMAL HIGH (ref 70–99)

## 2018-01-04 MED ORDER — ACYCLOVIR 200 MG/5ML PO SUSP
400.0000 mg | Freq: Three times a day (TID) | ORAL | Status: DC
Start: 2018-01-05 — End: 2018-01-04

## 2018-01-04 MED ORDER — POTASSIUM CHLORIDE 10 MEQ/50ML IV SOLN
10.0000 meq | INTRAVENOUS | Status: AC
  Administered 2018-01-04 (×5): 10 meq via INTRAVENOUS
  Filled 2018-01-04 (×2): qty 50

## 2018-01-04 MED ORDER — DEXTROSE 5 % IV SOLN
400.0000 mg | Freq: Three times a day (TID) | INTRAVENOUS | Status: DC
  Administered 2018-01-04 (×2): 400 mg via INTRAVENOUS
  Filled 2018-01-04 (×3): qty 8

## 2018-01-04 MED ORDER — SODIUM CHLORIDE 0.9 % IV SOLN
2.0000 g | Freq: Two times a day (BID) | INTRAVENOUS | Status: DC
  Administered 2018-01-04 (×2): 2 g via INTRAVENOUS
  Filled 2018-01-04 (×3): qty 2

## 2018-01-04 NOTE — Progress Notes (Signed)
EEG completed, results pending. 

## 2018-01-04 NOTE — Progress Notes (Addendum)
Spoke w/ MRI, relayed SWOT unable to take pt so RN will be bringing down. Plan for early afternoon

## 2018-01-04 NOTE — Progress Notes (Signed)
OT Cancellation Note  Patient Details Name: [name] MRN: [id number] DOB: [birth year]   Cancelled Treatment:    Reason Eval/Treat Not Completed: Patient not medically ready OT spoke with RN and holding at this time pending workup. Pt is tachynpenic and neutropenic with neurology workup of MRI pending to rule out ischemic CVA. OT to check back at the most appropriate time.   Jeri Modena, OTR/L  Acute Rehabilitation Services Pager: [phone] Office: [phone] .   Jeri Modena 01/04/2018, 8:26 AM

## 2018-01-04 NOTE — Progress Notes (Signed)
Received call from lab; pts platelets 23. Relayed to CCM NP

## 2018-01-04 NOTE — Progress Notes (Signed)
CCM MD came to evaluate patient at bedside and during the process,  he discussed code status with patient and family members who were at the bedside with patient. Patient did not want to be intubated during a code but would like all other measures to be taken. CCM MD made patient a full DNR instead of partial but RN could not get a hold of the doctor to clarify the code status. RN confirmed with family and patient, and they agreed they want partial code instead of full DNR. RN called Elink MD to change code status from DNR to partial.

## 2018-01-04 NOTE — Progress Notes (Signed)
PHARMACY - PHYSICIAN COMMUNICATION CRITICAL VALUE ALERT - BLOOD CULTURE IDENTIFICATION (BCID)  [name] is an 62 y.o. female who presented to [facility] on 01/29/2018 with a chief complaint of febrile neutropenia.   Assessment:  62 year old female admitted with febrile neutropenia. Gram- negative rods now growing out of blood from port a cath. Peripheral blood cultures remain no growth to date. Awaiting ID of gram negative rods.   Name of physician (or Provider) Contacted: [facility]  Current antibiotics: Vanc/Cefepime  Changes to prescribed antibiotics recommended:  Stop Vanc. Continue cefepime  No results found for this or any previous visit.  Jimmy Footman, PharmD, BCPS, BCIDP Infectious Diseases Clinical Pharmacist Phone: [phone] 01/04/2018  11:10 AM

## 2018-01-04 NOTE — Progress Notes (Signed)
Followed up with MRI who states unable to give time for pt. Will continue to follow

## 2018-01-04 NOTE — Progress Notes (Signed)
PHARMACY NOTE:  ANTIMICROBIAL RENAL DOSAGE ADJUSTMENT  Current antimicrobial regimen includes a mismatch between antimicrobial dosage and estimated renal function.  As per policy approved by the Pharmacy & Therapeutics and Medical Executive Committees, the antimicrobial dosage will be adjusted accordingly.  Current antimicrobial dosage:  Cefepime 2 gm every 24 hours  Indication: Febrile neutropenia/GNR bacteremia  Renal Function:  Estimated Creatinine Clearance: 33.6 mL/min (A) (by C-G formula based on SCr of 1.62 mg/dL (H)). []      On intermittent HD, scheduled: []      On CRRT    Antimicrobial dosage has been changed to: Cefepime 2 gm q 12 hours   Additional comments:   Thank you for allowing pharmacy to be a part of this patient's care.  Jimmy Footman, PharmD, BCPS, BCIDP Infectious Diseases Clinical Pharmacist Phone: [phone] 01/04/2018 10:52 AM

## 2018-01-04 NOTE — Progress Notes (Signed)
 [facility]  Date of Admission:  01/11/2018   Total days of antibiotics 2        Day 1 Cefepime         Day 2 Vancomycin         Day 1 Acyclovir         Patient ID: [name] is a 62 y.o. F with  Active Problems:   Neutropenic sepsis ([place])   Chemotherapy-induced neutropenia ([place])   Non-STEMI (non-ST elevated myocardial infarction) ([place])   LV dysfunction   . acyclovir  400 mg Oral TID  . chlorhexidine  15 mL Mouth Rinse BID  . Chlorhexidine Gluconate Cloth  6 each Topical Q0600  . mouth rinse  15 mL Mouth Rinse q12n4p  . mupirocin ointment  1 application Nasal BID  . pantoprazole (PROTONIX) IV  40 mg Intravenous QHS  . Tbo-filgastrim (GRANIX) SQ  480 mcg Subcutaneous q1800    SUBJECTIVE: A & A, garbled speech  Review of Systems: Review of Systems  Unable to perform ROS: Acuity of condition    No Known Allergies  OBJECTIVE: Vitals:   01/04/18 0400 01/04/18 0500 01/04/18 0600 01/04/18 0750  BP: (!) 137/96 (!) 140/91 138/86   Pulse: (!) 113 (!) 111 (!) 111   Resp: (!) 24 (!) 23 (!) 22   Temp:    98.1 F (36.7 C)  TempSrc:    Oral  SpO2: 97% 96% 98%   Weight:  65.7 kg    Height:       Body mass index is 24.86 kg/m.  Physical Exam  Constitutional: She is oriented to person, place, and time. She appears well-developed.  Resting in bed with Speech Therapy performing swallow assessment. Comfortable. Non-toxic.   HENT:  Mouth/Throat: Mucous membranes are normal. No oral lesions. Normal dentition. No dental abscesses.  Dry, cracked lips with crusted/ulcerations on lips.   Eyes: Pupils are equal, round, and reactive to light.  Cardiovascular: Normal rate, regular rhythm and normal heart sounds.  Pulmonary/Chest: Effort normal and breath sounds normal.  Abdominal: Soft. She exhibits no distension. There is no tenderness.  Lymphadenopathy:    She has no cervical adenopathy.  Neurological: She is alert and oriented to  person, place, and time.  Skin: Skin is warm and dry. No rash noted.  Psychiatric: She has a normal mood and affect. Judgment normal.  In good spirits today and engaged in care discussion  Vitals reviewed.   Lab Results Lab Results  Component Value Date   WBC 3.1 (L) 01/04/2018   HGB 9.5 (L) 01/04/2018   HCT 29.4 (L) 01/04/2018   MCV 89.1 01/04/2018   PLT 23 (LL) 01/04/2018    Lab Results  Component Value Date   CREATININE 1.62 (H) 01/04/2018   BUN 43 (H) 01/04/2018   NA 139 01/04/2018   K 3.0 (L) 01/04/2018   CL 112 (H) 01/04/2018   CO2 16 (L) 01/04/2018    Lab Results  Component Value Date   ALT 33 01/28/2018   AST 63 (H) 01/28/2018   ALKPHOS 44    BILITOT 1.8 (H) 01/29/2018     Microbiology: Recent Results (from the past 240 hour(s))  Blood culture (routine x 2)     Status: None (Preliminary result)   Collection Time: 01/17/2018 12:48 PM  Result Value Ref Range Status   Specimen Description BLOOD PORTA CATH  Final   Special Requests   Final  BOTTLES DRAWN AEROBIC AND ANAEROBIC Blood Culture adequate volume   Culture  Setup Time   Final    GRAM NEGATIVE RODS ANAEROBIC BOTTLE ONLY CRITICAL RESULT CALLED TO, READ BACK BY AND VERIFIED WITH: Hughie Closs PHARMD, AT 0802 01/04/18 BY Rush Landmark Performed at [facility], [place] [address] [place], [place] [address]    Culture GRAM NEGATIVE RODS  Final   Report Status PENDING  Incomplete  Blood culture (routine x 2)     Status: None (Preliminary result)   Collection Time: 01/07/2018  2:32 PM  Result Value Ref Range Status   Specimen Description BLOOD RIGHT ANTECUBITAL  Final   Special Requests   Final    BOTTLES DRAWN AEROBIC ONLY Blood Culture adequate volume   Culture   Final    NO GROWTH < 24 HOURS Performed at [facility], [place] [address] [place], [place] [address]    Report Status PENDING  Incomplete  Urine culture     Status: None   Collection Time: 01/17/2018  3:32 PM  Result Value Ref  Range Status   Specimen Description URINE, CATHETERIZED  Final   Special Requests NONE  Final   Culture   Final    NO GROWTH Performed at [facility], [place] [address] [place], [place] [address]    Report Status 01/03/2018 FINAL  Final  MRSA PCR Screening     Status: Abnormal   Collection Time: 01/23/2018  7:27 PM  Result Value Ref Range Status   MRSA by PCR POSITIVE (A) NEGATIVE Final    Comment:        The GeneXpert MRSA Assay (FDA approved for NASAL specimens only), is one component of a comprehensive MRSA colonization surveillance program. It is not intended to diagnose MRSA infection nor to guide or monitor treatment for MRSA infections. RESULT CALLED TO, READ BACK BY AND VERIFIED WITH: [place] 2344 01/21/2018 MITCHELL,L Performed at [facility], [place] [address], [place], [place] [address]    ASSESSMENT & PLAN:  1. Neutropenic Fever = fever curve has remained flat for 24h. WBC @ 3.1 with [place] 2300 after Granix injection. GNRs growing from one blood culture site that was identified about an hour ago >> BCID to follow for possible ID. Continue Cefepime. Stop vancomycin. CT scan of Abd/Pel was unremarkable other than known tumor; watery diarrhea in the setting of possible GI bleed. Discussed potential infiltrate on CXR. Will watch.   2. Pancytopenia = hgb stable after transfusions (3.3 >> 9.7). Plt 23K, WBC as above. Per heme/onc.   3. Diarrhea = watery stool in rectal tube. She had high volume out per chart review of 1610 cc yesterday. Would check GI pathogen panel   4. AKI = improving; creatinine down to 1.6 today. Metabolic derangements with high volume stool/sepsis/AKI.   Leslie Madeira, MSN, NP-C [facility] [place] Cell: [phone] Pager: [phone]  01/04/2018  9:13 AM

## 2018-01-04 NOTE — Care Management Note (Signed)
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, 12/3 concern for CVA work up in progress               Action/Plan: PTA pt lived at home, PT/OT evals pending for recommendations, CM to follow for transition of care needs  Expected Discharge Date:                  Expected Discharge Plan:     In-House Referral:     Discharge planning Services  CM Consult  Post Acute Care Choice:    Choice offered to:     DME Arranged:    DME Agency:     HH Arranged:    [place] Agency:     Status of Service:  In process, will continue to follow  If discussed at Long Length of Stay Meetings, dates discussed:    Discharge Disposition:   Additional Comments:  Dawayne Patricia, RN 01/04/2018, 1:44 PM

## 2018-01-04 NOTE — Progress Notes (Signed)
[facility] will sign off.   Medication Recommendations/Other recommendations (labs, testing, etc):  See yesterday's consult note Follow up as an outpatient:  Regular cardiologist

## 2018-01-04 NOTE — Evaluation (Signed)
Clinical/Bedside Swallow Evaluation Patient Details  Name: [name] MRN: [id number] Date of Birth: [birth year]  Today's Date: 01/04/2018 Time: SLP Start Time (ACUTE ONLY): 0856 SLP Stop Time (ACUTE ONLY): 0915 SLP Time Calculation (min) (ACUTE ONLY): 19 min  Past Medical History:  Past Medical History:  Diagnosis Date  . Ankle fracture   . Dementia ([place])   . History of blood transfusion   . Hypertension   . Left-sided weakness    post stroke  . Stroke [facility]) 2009   "6 light" "2 Major"- "I take medication for memory"   Past Surgical History:  Past Surgical History:  Procedure Laterality Date  . CATARACT EXTRACTION, BILATERAL    . HARDWARE REMOVAL Right 08/26/2016   Procedure: RIGHT ANKLE HARDWARE REMOVAL AND DEBRIDEMENT OF BONE;  Surgeon: Leandrew Koyanagi, MD;  Location: [facility];  Service: Orthopedics;  Laterality: Right;  . ORIF ANKLE FRACTURE Right 04/15/2016   Procedure: OPEN REDUCTION INTERNAL FIXATION (ORIF) RIGHT LATERAL MALLEOLUS AND SYNDESMOSIS;  Surgeon: Leandrew Koyanagi, MD;  Location: [facility];  Service: Orthopedics;  Laterality: Right;  . TUBAL LIGATION     HPI:  Pt is a 62 year old woman admitted with neutropenic sepsis 12/1 s/p unwitnessed fall at home. She has a h/o multiple strokes with some residual left-sided weakness and some L facial droop that acutely worsened, with code stroke called 12/2. CT Head was negative for acute changes; MRI pending. PMH also includes lymphoma s/p chemo, HTN, dementia   Assessment / Plan / Recommendation Clinical Impression  Pt has L sided sensorimotor deficits, which per chart may be acutely worsened from baseline. Through written choices, as pt is very hard of hearing, she indicates that she is on a chopped diet with honey thick liquids at baseline (unable to confirm this via chart review). At this point in time, all POs attempted (purees, ice chips) need to be orally suctioned from her mouth as she has minimal manipulation and posterior  transit. A pharyngeal swallow response is not noted. Recommend that she remain NPO for now with additional PO trials only with SLP in order to assess readiness for MBS. Would perform frequent oral care given dried secretions noted throughout oral cavity and lips today. SLP Visit Diagnosis: Dysphagia, unspecified (R13.10)    Aspiration Risk  Severe aspiration risk;Risk for inadequate nutrition/hydration    Diet Recommendation NPO   Medication Administration: Via alternative means    Other  Recommendations Oral Care Recommendations: Oral care QID Other Recommendations: Have oral suction available   Follow up Recommendations (tba)      Frequency and Duration min 2x/week  2 weeks       Prognosis Prognosis for Safe Diet Advancement: Fair Barriers to Reach Goals: Severity of deficits;Time post onset;Other (Comment)(baseline dysphagia per pt)      Swallow Study   General HPI: Pt is a 62 year old woman admitted with neutropenic sepsis 12/1 s/p unwitnessed fall at home. She has a h/o multiple strokes with some residual left-sided weakness and some L facial droop that acutely worsened, with code stroke called 12/2. CT Head was negative for acute changes; MRI pending. PMH also includes lymphoma s/p chemo, HTN, dementia Type of Study: Bedside Swallow Evaluation Previous Swallow Assessment: none in chart but pt says she was on honey thick liquids and chopped foods Diet Prior to this Study: NPO Temperature Spikes Noted: No Respiratory Status: Nasal cannula History of Recent Intubation: No Behavior/Cognition: Alert;Cooperative;Other (Comment)(very HOH) Oral Cavity Assessment: Dried secretions Oral Care Completed by SLP: Recent  completion by staff Oral Cavity - Dentition: Adequate natural dentition Patient Positioning: Upright in bed Baseline Vocal Quality: Normal(although minimal output) Volitional Swallow: Unable to elicit    Oral/Motor/Sensory Function Overall Oral Motor/Sensory  Function: Moderate impairment Facial ROM: Reduced left;Suspected CN VII (facial) dysfunction Facial Symmetry: Abnormal symmetry left;Suspected CN VII (facial) dysfunction Facial Strength: Reduced left;Suspected CN VII (facial) dysfunction Facial Sensation: Reduced left;Suspected CN V (Trigeminal) dysfunction Lingual ROM: Reduced right;Reduced left Lingual Symmetry: Within Functional Limits Lingual Strength: Reduced   Ice Chips Ice chips: Impaired Presentation: Spoon Oral Phase Impairments: Poor awareness of bolus;Reduced labial seal;Reduced lingual movement/coordination Oral Phase Functional Implications: Left anterior spillage;Oral holding Pharyngeal Phase Impairments: Unable to trigger swallow   Thin Liquid Thin Liquid: Not tested    Nectar Thick Nectar Thick Liquid: Not tested   Honey Thick Honey Thick Liquid: Not tested   Puree Puree: Impaired Presentation: Spoon Oral Phase Impairments: Reduced labial seal;Reduced lingual movement/coordination;Poor awareness of bolus Oral Phase Functional Implications: Left lateral sulci pocketing;Oral holding Pharyngeal Phase Impairments: Unable to trigger swallow   Solid     Solid: Not tested      Germain Osgood 01/04/2018,9:58 AM  Germain Osgood, M.A. [place] Acute Environmental education officer [phone] Office [phone]

## 2018-01-04 NOTE — Progress Notes (Signed)
Hematology  CBC    Component Value Date/Time   WBC 3.1 (L) 01/04/2018 0517   RBC 3.30 (L) 01/04/2018 0517   HGB 9.5 (L) 01/04/2018 0517   HGB 12.8 04/08/2007 1344   HCT 29.4 (L) 01/04/2018 0517   HCT 37.3 04/08/2007 1344   PLT 23 (LL) 01/04/2018 0517   PLT 256 04/08/2007 1344   MCV 89.1 01/04/2018 0517   MCV 86.2 04/08/2007 1344   MCH 28.8 01/04/2018 0517   MCHC 32.3 01/04/2018 0517   RDW 17.5 (H) 01/04/2018 0517   RDW 13.1 04/08/2007 1344   LYMPHSABS 0.1 (L) 01/04/2018 0517   LYMPHSABS 3.4 (H) 04/08/2007 1344   MONOABS 0.4 01/04/2018 0517   MONOABS 1.0 (H) 04/08/2007 1344   EOSABS 0.0 01/04/2018 0517   EOSABS 0.7 (H) 04/08/2007 1344   BASOSABS 0.0 01/04/2018 0517   BASOSABS 0.0 04/08/2007 1344   Platelets are stable ANC is 2.3 Discontinue Granix Hemoglobin 9.5

## 2018-01-04 NOTE — Progress Notes (Addendum)
 STROKE TEAM PROGRESS NOTE   SUBJECTIVE (INTERVAL HISTORY) Her RN is at the bedside. Pt neurologically no significant change from last night. She still has severe dysarthria to anarthria, able to follow simple commands, significant left facial droop, moving all extremities. Cre improving but still has mild SOB. Hb stable and WBC much improved. No fever but tachycardia. Blood culture positive for G- rods.    OBJECTIVE Temp:  [97.9 F (36.6 C)-98.3 F (36.8 C)] 98.1 F (36.7 C) (12/03 0750) Pulse Rate:  [104-132] 111 (12/03 0600) Cardiac Rhythm: Sinus tachycardia (12/03 0800) Resp:  [21-30] 22 (12/03 0600) BP: (134-156)/(86-124) 138/86 (12/03 0600) SpO2:  [90 %-99 %] 98 % (12/03 0600) Weight:  [65.7 kg] 65.7 kg (12/03 0500)  Recent Labs  Lab 01/27/2018 1701 01/03/18 1854  GLUCAP 140* 170*   Recent Labs  Lab 01/20/2018 1247 01/24/2018 1748 01/03/18 0650 01/04/18 0517  NA 130* 132* 137 139  K 3.1* 3.0* 2.9* 3.0*  CL 98 103 107 112*  CO2 14* 21* 20* 16*  GLUCOSE 212* 169* 117* 146*  BUN 43* 43* 42* 43*  CREATININE 2.48* 2.02* 1.84* 1.62*  CALCIUM 8.2* 7.4* 7.9* 8.2*  MG  --  2.0 2.0 2.0  PHOS  --   --  3.4 2.7   Recent Labs  Lab 01/24/2018 1247  AST 63*  ALT 33  ALKPHOS 44  BILITOT 1.8*  PROT 5.7*  ALBUMIN 2.1*   Recent Labs  Lab 01/26/2018 1247 01/23/2018 2049 01/03/18 0650 01/03/18 0822 01/03/18 1652 01/04/18 0213 01/04/18 0517  WBC 0.4* 0.3* 0.6*  --   --   --  3.1*  NEUTROABS  --   --   --   --   --   --  2.3  HGB 3.3* 8.8* 8.7* 8.7* 9.4* 9.7* 9.5*  HCT 11.2* 27.2* 26.5* 27.9* 29.1* 28.9* 29.4*  MCV 97.4 90.4 89.2  --   --   --  89.1  PLT 32* 27* 25*  --   --   --  23*   Recent Labs  Lab 01/23/2018 2049 01/03/18 0650 01/03/18 0822 01/03/18 1503 01/03/18 2059  TROPONINI 4.57* 7.18*  7.13* 6.72* 4.81* 2.51*   Recent Labs    01/14/2018 1748  LABPROT 17.5*  INR 1.46   Recent Labs    01/26/2018 1831  COLORURINE YELLOW  LABSPEC 1.017  PHURINE 5.0   GLUCOSEU 50*  HGBUR LARGE*  BILIRUBINUR NEGATIVE  KETONESUR NEGATIVE  PROTEINUR 30*  NITRITE NEGATIVE  LEUKOCYTESUR NEGATIVE       Component Value Date/Time   CHOL 80 01/04/2018 0517   TRIG 153 (H) 01/04/2018 0517   HDL <10 (L) 01/04/2018 0517   CHOLHDL NOT CALCULATED 01/04/2018 0517   VLDL 31 01/04/2018 0517   LDLCALC NOT CALCULATED 01/04/2018 0517   Lab Results  Component Value Date   HGBA1C 6.0 (H) 01/04/2018      Component Value Date/Time   LABOPIA NONE DETECTED 03/14/2007 2212   COCAINSCRNUR NONE DETECTED 03/14/2007 2212   LABBENZ NONE DETECTED 03/14/2007 2212   AMPHETMU NONE DETECTED 03/14/2007 2212   THCU NONE DETECTED 03/14/2007 2212   LABBARB  03/14/2007 2212    NONE DETECTED        DRUG SCREEN FOR MEDICAL PURPOSES ONLY.  IF CONFIRMATION IS NEEDED FOR ANY PURPOSE, NOTIFY LAB WITHIN 5 DAYS.    No results for input(s): ETH in the last 168 hours.  I have personally reviewed the radiological images below and agree with the radiology interpretations.  Ct Abdomen Pelvis Wo Contrast  Result Date: 01/11/2018 CLINICAL DATA:  Left-sided weakness. Recent chemotherapy for lymphoma presents today post fall. Found this morning on ground. EXAM: CT CHEST, ABDOMEN AND PELVIS WITHOUT CONTRAST TECHNIQUE: Multidetector CT imaging of the chest, abdomen and pelvis was performed following the standard protocol without IV contrast. COMPARISON:  MRI pelvis 12/27/2017 FINDINGS: CT CHEST FINDINGS Cardiovascular: Right IJ Port-A-Cath has tip in the SVC. Heart is normal size. Mild calcified plaque over the thoracic aorta. Mild calcified plaque over the left main and 3 vessel coronary arteries. Remaining vascular structures are unremarkable. Mediastinum/Nodes: No evidence of mediastinal or hilar adenopathy. Remaining mediastinal structures are unremarkable. Lungs/Pleura: Lungs are adequately inflated without focal airspace consolidation or effusion. There is minimal bibasilar dependent  atelectasis. Airways are normal. Musculoskeletal: Degenerative change of the spine. CT ABDOMEN PELVIS FINDINGS Hepatobiliary: Liver and biliary tree are normal. Gallbladder is within normal. Pancreas: Normal. Spleen: Normal. Adrenals/Urinary Tract: Adrenal glands are normal. Kidneys normal size without hydronephrosis or nephrolithiasis. Couple small calcifications over the renal hilum likely vascular. 2.2 cm cyst over the mid pole right kidney. Ureters and bladder are normal. Stomach/Bowel: Stomach and small bowel are normal. Appendix is normal. Colon is normal. Vascular/Lymphatic: Mild-to-moderate calcified plaque over the abdominal aorta. No adenopathy. Reproductive: Uterus and left ovary are normal. Solid right adnexal mass measuring 4.1 x 6.5 cm unchanged from recent MRI. Other: No free fluid or focal inflammatory change. Musculoskeletal: Degenerative change of the spine. No lytic/sclerotic lesions. IMPRESSION: No acute findings in the chest, abdomen or pelvis. Known solid right adnexal mass measuring 4.1 x 6.5 cm unchanged from recent MRI and likely neoplastic in nature. No adenopathy. Continue to recommend surgical consultation. 2.2 cm right renal cyst. Aortic Atherosclerosis (ICD10-I70.0). Atherosclerotic coronary artery disease. Electronically Signed   By: Marin Olp M.D.   On: 01/11/2018 16:55   Dg Chest 1 View  Result Date: 01/03/2018 CLINICAL DATA:  62 year old female with shortness of breath. EXAM: CHEST  1 VIEW COMPARISON:  Chest radiograph dated 01/06/2018 FINDINGS: Right pectoral infusion catheter with tip over central SVC in similar position. Slight interstitial prominence may represent mild edema. Bilateral perihilar and left infrahilar streaky densities may represent atelectasis although infiltrate is not excluded. There is no pleural effusion or pneumothorax. There is mild cardiomegaly. Atherosclerotic calcification of the aortic arch. No acute osseous pathology. Degenerative changes of the  spine. IMPRESSION: 1. Mild cardiomegaly with possible mild edema. 2. Bilateral perihilar and left infrahilar atelectasis versus infiltrate. Electronically Signed   By: Anner Crete M.D.   On: 01/03/2018 21:21   Dg Pelvis 1-2 Views  Result Date: 01/15/2018 CLINICAL DATA:  Fall EXAM: PELVIS - 1-2 VIEW COMPARISON:  None. FINDINGS: There is no evidence of pelvic fracture or diastasis. No pelvic bone lesions are seen. IMPRESSION: Negative. Electronically Signed   By: Franchot Gallo M.D.   On: 01/13/2018 13:01   Ct Head Wo Contrast  Result Date: 01/27/2018 CLINICAL DATA:  Left-sided weakness with recent fall history of lymphoma EXAM: CT HEAD WITHOUT CONTRAST CT CERVICAL SPINE WITHOUT CONTRAST TECHNIQUE: Multidetector CT imaging of the head and cervical spine was performed following the standard protocol without intravenous contrast. Multiplanar CT image reconstructions of the cervical spine were also generated. COMPARISON:  MRI 03/14/2007 FINDINGS: CT HEAD FINDINGS Brain: No acute territorial infarction, hemorrhage or intracranial mass. Mild atrophy. Mild small vessel ischemic changes of the white matter. Chronic lacunar infarct in the left basal ganglia. Stable ventricle size. Vascular: No hyperdense vessels.  Carotid  vascular calcification Skull: Normal. Negative for fracture or focal lesion. Sinuses/Orbits: No acute orbital abnormality. Moderate mucosal opacification of the ethmoid sinuses. Complete opacification of right maxillary sinus with small air bubbles. Mucosal thickening left maxillary sinus. Extension of opacifications into the right nasal passage. Other: None CT CERVICAL SPINE FINDINGS Alignment: Straightening of the cervical spine. No subluxation. Facet alignment within normal limits. Skull base and vertebrae: No acute fracture. No primary bone lesion or focal pathologic process. Soft tissues and spinal canal: No prevertebral fluid or swelling. No visible canal hematoma. Disc levels:  Mild  degenerative changes at C4-C5 and C5-C6. Upper chest: Negative. Other: None IMPRESSION: 1. No CT evidence for acute intracranial abnormality. Atrophy and small vessel ischemic changes of the white matter. Chronic lacunar infarct in the left basal ganglia 2. No acute osseous abnormality of the cervical spine 3. Paranasal sinus disease Electronically Signed   By: Donavan Foil M.D.   On: 01/11/2018 16:52   Ct Chest Wo Contrast  Result Date:  CLINICAL DATA:  Left-sided weakness. Recent chemotherapy for lymphoma presents today post fall. Found this morning on ground. EXAM: CT CHEST, ABDOMEN AND PELVIS WITHOUT CONTRAST TECHNIQUE: Multidetector CT imaging of the chest, abdomen and pelvis was performed following the standard protocol without IV contrast. COMPARISON:  MRI pelvis 12/27/2017 FINDINGS: CT CHEST FINDINGS Cardiovascular: Right IJ Port-A-Cath has tip in the SVC. Heart is normal size. Mild calcified plaque over the thoracic aorta. Mild calcified plaque over the left main and 3 vessel coronary arteries. Remaining vascular structures are unremarkable. Mediastinum/Nodes: No evidence of mediastinal or hilar adenopathy. Remaining mediastinal structures are unremarkable. Lungs/Pleura: Lungs are adequately inflated without focal airspace consolidation or effusion. There is minimal bibasilar dependent atelectasis. Airways are normal. Musculoskeletal: Degenerative change of the spine. CT ABDOMEN PELVIS FINDINGS Hepatobiliary: Liver and biliary tree are normal. Gallbladder is within normal. Pancreas: Normal. Spleen: Normal. Adrenals/Urinary Tract: Adrenal glands are normal. Kidneys normal size without hydronephrosis or nephrolithiasis. Couple small calcifications over the renal hilum likely vascular. 2.2 cm cyst over the mid pole right kidney. Ureters and bladder are normal. Stomach/Bowel: Stomach and small bowel are normal. Appendix is normal. Colon is normal. Vascular/Lymphatic: Mild-to-moderate calcified  plaque over the abdominal aorta. No adenopathy. Reproductive: Uterus and left ovary are normal. Solid right adnexal mass measuring 4.1 x 6.5 cm unchanged from recent MRI. Other: No free fluid or focal inflammatory change. Musculoskeletal: Degenerative change of the spine. No lytic/sclerotic lesions. IMPRESSION: No acute findings in the chest, abdomen or pelvis. Known solid right adnexal mass measuring 4.1 x 6.5 cm unchanged from recent MRI and likely neoplastic in nature. No adenopathy. Continue to recommend surgical consultation. 2.2 cm right renal cyst. Aortic Atherosclerosis (ICD10-I70.0). Atherosclerotic coronary artery disease. Electronically Signed   By: Marin Olp M.D.   On: 01/31/2018 16:55   Ct Cervical Spine Wo Contrast  Result Date: 01/21/2018 CLINICAL DATA:  Left-sided weakness with recent fall history of lymphoma EXAM: CT HEAD WITHOUT CONTRAST CT CERVICAL SPINE WITHOUT CONTRAST TECHNIQUE: Multidetector CT imaging of the head and cervical spine was performed following the standard protocol without intravenous contrast. Multiplanar CT image reconstructions of the cervical spine were also generated. COMPARISON:  MRI 03/14/2007 FINDINGS: CT HEAD FINDINGS Brain: No acute territorial infarction, hemorrhage or intracranial mass. Mild atrophy. Mild small vessel ischemic changes of the white matter. Chronic lacunar infarct in the left basal ganglia. Stable ventricle size. Vascular: No hyperdense vessels.  Carotid vascular calcification Skull: Normal. Negative for fracture or focal lesion. Sinuses/Orbits: No  acute orbital abnormality. Moderate mucosal opacification of the ethmoid sinuses. Complete opacification of right maxillary sinus with small air bubbles. Mucosal thickening left maxillary sinus. Extension of opacifications into the right nasal passage. Other: None CT CERVICAL SPINE FINDINGS Alignment: Straightening of the cervical spine. No subluxation. Facet alignment within normal limits. Skull base  and vertebrae: No acute fracture. No primary bone lesion or focal pathologic process. Soft tissues and spinal canal: No prevertebral fluid or swelling. No visible canal hematoma. Disc levels:  Mild degenerative changes at C4-C5 and C5-C6. Upper chest: Negative. Other: None IMPRESSION: 1. No CT evidence for acute intracranial abnormality. Atrophy and small vessel ischemic changes of the white matter. Chronic lacunar infarct in the left basal ganglia 2. No acute osseous abnormality of the cervical spine 3. Paranasal sinus disease Electronically Signed   By: Donavan Foil M.D.   On: 01/04/2018 16:52   Dg Chest Port 1 View  Result Date: 01/08/2018 CLINICAL DATA:  Fall.  History of lymphoma EXAM: PORTABLE CHEST 1 VIEW COMPARISON:  Chest two-view 03/15/2007 FINDINGS: Heart size mildly enlarged. Negative for heart failure. Atherosclerotic aortic arch. Lungs are clear without infiltrate or effusion. Port-A-Cath tip in the SVC. IMPRESSION: No active disease. Electronically Signed   By: Franchot Gallo M.D.   On: 01/10/2018 13:00   Dg Knee Left Port  Result Date: 01/18/2018 CLINICAL DATA:  Fall EXAM: PORTABLE LEFT KNEE - 1-2 VIEW COMPARISON:  None. FINDINGS: No evidence of fracture, dislocation, or joint effusion. No evidence of arthropathy or other focal bone abnormality. Soft tissues are unremarkable. IMPRESSION: Negative. Electronically Signed   By: Franchot Gallo M.D.   On: 01/08/2018 13:01   Dg Knee Right Port  Result Date: 01/05/2018 CLINICAL DATA:  Fall EXAM: PORTABLE RIGHT KNEE - 1-2 VIEW COMPARISON:  None. FINDINGS: No evidence of fracture, dislocation, or joint effusion. No evidence of arthropathy or other focal bone abnormality. Soft tissues are unremarkable. IMPRESSION: Negative. Electronically Signed   By: Franchot Gallo M.D.   On: 01/19/2018 13:06   Ct Head Code Stroke Wo Contrast  Result Date: 01/03/2018 CLINICAL DATA:  Code stroke.  62 y/o  F; left facial droop. EXAM: CT HEAD WITHOUT CONTRAST  TECHNIQUE: Contiguous axial images were obtained from the base of the skull through the vertex without intravenous contrast. COMPARISON:  01/08/2018 CT head. FINDINGS: Brain: No evidence of acute infarction, hemorrhage, hydrocephalus, extra-axial collection or mass lesion/mass effect. Small chronic infarcts are present within the bilateral caudate bodies and the left corona radiata. Few nonspecific white matter hypodensities compatible with chronic microvascular ischemic changes are stable. Stable mild volume loss of the brain. Vascular: Calcific atherosclerosis of carotid siphons and vertebral arteries. No hyperdense vessel identified. Skull: Normal. Negative for fracture or focal lesion. Sinuses/Orbits: Extensive paranasal sinus disease with complete opacification of right maxillary sinus. Normal aeration of the mastoid air cells. Bilateral intra-ocular lens replacement. Other: None. ASPECTS [facility] Stroke Program Early CT Score) - Ganglionic level infarction (caudate, lentiform nuclei, internal capsule, insula, M1-M3 cortex): 7 - Supraganglionic infarction (M4-M6 cortex): 3 Total score (0-10 with 10 being normal): 10 IMPRESSION: 1. No acute intracranial abnormality identified. Stable CT of the head. 2. ASPECTS is 10 3. Stable chronic microvascular ischemic changes and volume loss of the brain. Stable bilateral chronic lacunar infarcts in the basal ganglia. These results were communicated to Dr. Lorraine Lax at [place] 12/2/2019by text page via the [facility] messaging system. Electronically Signed   By: Kristine Garbe M.D.   On: 01/03/2018 19:17  MRI and MRA pending  Carotid Doppler  pending  TTE pending  EEG pending   PHYSICAL EXAM  Temp:  [97.9 F (36.6 C)-98.3 F (36.8 C)] 98.1 F (36.7 C) (12/03 0750) Pulse Rate:  [104-132] 111 (12/03 0600) Resp:  [21-30] 22 (12/03 0600) BP: (134-156)/(86-124) 138/86 (12/03 0600) SpO2:  [90 %-99 %] 98 % (12/03 0600) Weight:  [65.7 kg] 65.7 kg (12/03  0500)  General - moderate nourished, well developed, in mild respiratory distress  Ophthalmologic - fundi not visualized due to noncooperation. Bilateral scleral jaundice.   Cardiovascular - Regular rhythm, but tachycardia.  Neuro - awake alert, anarthria, able to produce sound, but not words, however able to write, although not coherent, but orientated to self, age and place. Not able to tell me the time by writing. Following most simple commands, and able to mimic actions. PERRL, EOMI, blinking to visual threat bilaterally, left facial droop, tongue midline. BUE 3+/5 and BLE 3/5. DTR diminished and no babinski. Sensation not cooperative, coordination intact BUE but very slow. Gait not tested.    ASSESSMENT/PLAN [name] is a 62 y.o. 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 No tPA given due to thrombocytopenia.    Stroke suspected: right cortical infarct possible, MRI pending. Etiology unclear, could be due to hypotension with severe anemia, endocarditis, paradoxical emboli, hypercoagulable state due to malignancy or severe sepsis. However, CNS metastasis, CNS infection and undiagnosed afib are still in DDx.   Resultant anarthria, and left facial droop  MRI  pending  MRA  pending  Carotid Doppler  pending  2D Echo  pending  LE venous doppler pending  EEG pending  Consider TEE to rule out endocarditis and PFO once stable  Consider MRI with contrast once Cre normalized to rule out brain metastasis  Not candidate for LP due to thrombocytopenia  LDL low not able to calculate  HgbA1c 6.0  SCDs for VTE prophylaxis  aspirin 325 mg daily and clopidogrel 75 mg daily prior to admission, now on No antithrombotic due to severe pancytopenia   Ongoing aggressive stroke risk factor management  Therapy  recommendations:  Pending   Disposition:  Pending   Severe sepsis and septic shock  Neutropenia sepsis on admission  Blood culture positive for G- rods  On cefepime, vancomycin and acyclovir  CCM on board  Will consider TEE to rule out endocarditis once stable  Not candidate for LP, continue empiric Abx  Severe anemia, thrombocytopenia and neutropenia   Likely due to chemo  Hb 3.3 s/p PRBC, this am 9.5  Platelet 32->25->23   WBC 0.3->0.6->3.1  Not candidate for antiplatelet and LP at this time.  Hypotension Hx of hypertension . SBP 80-90s on admission  . S/p fluid resuscitation . Stable now  BP goal normotensive  AKI  Baseline 0.84  This time Cre 2.48->2.02->1.84->1.62  Continue IVF as per primary team  Consider MRI with contrast once Cre normalizes  Other Stroke Risk Factors  Coronary artery disease / MI  CHF   Hx of stroke 2009 with mild right sided weakness  Other Active Problems  Lymphoma s/p chemo  Dementia   Scleral jaundice with INR 1.46 but normal AST/ALT  Elevated troponin - down trending  Hospital day # 2  This patient is critically ill due to stroke, severe pancytopenia, hypotension, AKI, sepsis and at significant risk of  neurological worsening, death form septic shock, heart failure, recurrent stroke, CNS infection, respiratory failure. This patient's care requires constant monitoring of vital signs, hemodynamics, respiratory and cardiac monitoring, review of multiple databases, neurological assessment, discussion with family, other specialists and medical decision making of high complexity. I spent 45 minutes of neurocritical care time in the care of this patient.   Rosalin Hawking, MD PhD Stroke Neurology 01/04/2018 1:04 PM    To contact Stroke Continuity provider, please refer to [email or web address] After hours, contact General Neurology

## 2018-01-04 NOTE — Progress Notes (Addendum)
NAME:  [name], MRN:  [id number], DOB:  1955/02/06, LOS: 2 ADMISSION DATE:  01/16/2018, CONSULTATION DATE:  12/1 REFERRING MD:  Jeanell Sparrow, CHIEF COMPLAINT:  Circulatory shock   Brief History   62 year old female w/ large B cell Lymphoma last chemo completed 23rd. Admitted after being found on floor at home. In ER hypotensive. Hgb < 4. Concern for sepsis +/- GIB, neutropenic fever.  GNR in blood.    History of present illness   62 year old female w/ multiple co-morbids as listed below. Just completed last cycle of Chemo 11/20. Had been instructed to take Etoposide 3 tabs instead of 5 (but she took all 5). Did c/o watery diarrhea and sores on her mouth starting  11/29. Presented via EMS after being found on floor at home after an unwitnessed fall. On EMS arrival SBP 70s. In ER core temp 93.8, lactic acid 7.66, & hgb < 4. PCCM asked to admit.  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   Significant Hospital Events   12/01 Admitted w/ working dx of sepsis, colitis/ GIB  12/02 Urinary retention  12/03 Concern for CVA symptoms overnight  Consults:  Oncology  ID  Procedures:     Significant Diagnostic Tests:  CT Head / Neck 12/1 >> no acute abnormality, no acute process with cervical spine MRI Brain 12/3 >>   Micro Data:  Blood cultures x 2 12/1 >> GNR >> UC 12/1 >> negative   Antimicrobials: PER ID  Vanc 12/1 >> 12/3 meropenem 12/1 >> 12/2 Fluconazole 12/1 >> 12/1 Cefepime 12/1 >>  Interim history/subjective:  RN reports pt failed SLP evaluation.  NPO.  Concern for increased slurred speech / left facial droop. MRI pending.   Objective   Blood pressure 138/86, pulse (!) 111, temperature 98.1 F (36.7 C), temperature source Oral, resp. rate (!) 22, height 5\' 4"  (1.626 m), weight 65.7 kg, SpO2 98 %.        Intake/Output  Summary (Last 24 hours) at 01/04/2018 0820 Last data filed at 01/04/2018 0600 Gross per 24 hour  Intake 2884.16 ml  Output 4190 ml  Net -1305.84 ml   Filed Weights   01/21/2018 1650 01/03/18 0400 01/04/18 0500  Weight: 62.4 kg 64.8 kg 65.7 kg    Examination: General: chronically ill appearing female lying in bed in NAD HEENT: MM pink/dry, small ulceration on left lower lip, no jvd, scleral icterus  Neuro: Awake, alert, writes messages appropriately, does not attempt to communicate with voice, nods yes/no, follows commands CV: s1s2 rrr, no m/r/g, right chest wall port  PULM: even/non-labored, lungs bilaterally coarse KA:JGOT, non-tender, bsx4 active  Extremities: warm/dry, no edema  Skin: no rashes or lesions  Resolved Hospital Problem list   Shock   Assessment & Plan:   Neutropenic Fever   -shock resolved with IVF  -GNR in blood, ? Right perihilar infiltrate P: Monitor in ICU  Follow cultures ABX per ID, appreciate input  Neutropenic precautions Follow up CXR in am   Elevated Troponin -likely due to severe anemia, peak at 7.18 P: Cardiology input appreciated  ICU monitoring   Acute/subacute blood loss anemia  -presume colitis (Chemo induced vs ischemic vs infective colitis), negative CT abd -baseline hgb 8.5 P: Trend CBC  Transfuse as appropriate  PPI  Pancytopenia s/p chemo P: Neutropenic precautions  Hold ASA, plavix Oncology input appreciated > rec's for granix 36mcg daily until [place] is > 2505  H/o systolic CM (ef 39-76%) P: Hold home antihypertensives  KVO IVF's   AKI -Baseline cr 1.35  Fluid and electrolyte imbalance: hypokalemia, hyponatremia  P: Assess CMP in am (scleral icterus) Follow urinary output Replace electrolytes as indicated Avoid nephrotoxic agents, ensure adequate renal perfusion  Diarrhea  -watery stool in rectal tube  P: Assess GI pathogen panel  Anion gap metabolic acidosis/lactic acidosis  P: Follow chemistry, correct  underlying causes as able  Large B cell Lymphoma  -last chemo was 11/20 and 11/23 P: Follows at [place] input   Best practice:  Diet: NPO Pain/Anxiety/Delirium protocol (if indicated): na VAP protocol (if indicated): na DVT prophylaxis: SCD GI prophylaxis: PPI Glucose control: ssi  Mobility: bedrest Code Status: full code  Family Communication: No family available at bedside 12/3.   Disposition: ICU, critically ill treating for life threatening infection + pancytopenia w/ possible GIB   Labs   CBC: Recent Labs  Lab 01/12/2018 1247 01/05/2018 2049 01/03/18 0650 01/03/18 0822 01/03/18 1652 01/04/18 0213  WBC 0.4* 0.3* 0.6*  --   --   --   HGB 3.3* 8.8* 8.7* 8.7* 9.4* 9.7*  HCT 11.2* 27.2* 26.5* 27.9* 29.1* 28.9*  MCV 97.4 90.4 89.2  --   --   --   PLT 32* 27* 25*  --   --   --     Basic Metabolic Panel: Recent Labs  Lab 01/30/2018 1247 01/15/2018 1748 01/03/18 0650 01/04/18 0517  NA 130* 132* 137 139  K 3.1* 3.0* 2.9* 3.0*  CL 98 103 107 112*  CO2 14* 21* 20* 16*  GLUCOSE 212* 169* 117* 146*  BUN 43* 43* 42* 43*  CREATININE 2.48* 2.02* 1.84* 1.62*  CALCIUM 8.2* 7.4* 7.9* 8.2*  MG  --  2.0 2.0 2.0  PHOS  --   --  3.4 2.7   GFR: Estimated Creatinine Clearance: 33.6 mL/min (A) (by C-G formula based on SCr of 1.62 mg/dL (H)). Recent Labs  Lab 01/13/2018 1247 01/23/2018 1255 01/14/2018 1441 01/14/2018 1748 01/29/2018 1749 01/17/2018 2049 01/03/18 0650  PROCALCITON  --   --   --  61.17  --   --   --   WBC 0.4*  --   --   --   --  0.3* 0.6*  LATICACIDVEN  --  7.66* 3.91*  --  2.2*  --  1.4    Liver Function Tests: Recent Labs  Lab 01/10/2018 1247  AST 63*  ALT 33  ALKPHOS 44  BILITOT 1.8*  PROT 5.7*  ALBUMIN 2.1*   No results for input(s): LIPASE, AMYLASE in the last 168 hours. No results for input(s): AMMONIA in the last 168 hours.  ABG    Component Value Date/Time   PHART 7.403 01/03/2018 0444   PCO2ART 21.1 (L) 01/03/2018 0444   PO2ART 78.0  (L) 01/03/2018 0444   HCO3 13.2 (L) 01/03/2018 0444   TCO2 14 (L) 01/03/2018 0444   ACIDBASEDEF 9.0 (H) 01/03/2018 0444   O2SAT 96.0 01/03/2018 0444     Coagulation Profile: Recent Labs  Lab 01/29/2018 1748  INR 1.46    Cardiac Enzymes: Recent Labs  Lab 01/30/2018 2049 01/03/18 0650 01/03/18 0822 01/03/18 1503 01/03/18 2059  TROPONINI 4.57* 7.18*  7.13* 6.72* 4.81* 2.51*    HbA1C: Hgb A1c MFr Bld  Date/Time Value Ref Range Status  01/04/2018 05:17 AM 6.0 (H) 4.8 -  5.6 % Final    Comment:    (NOTE) Pre diabetes:          5.7%-6.4% Diabetes:              >6.4% Glycemic control for   <7.0% adults with diabetes   03/15/2007 03:40 AM   Final   5.8 (NOTE)   The ADA recommends the following therapeutic goals for glycemic   control related to Hgb A1C measurement:   Goal of Therapy:   < 7.0% Hgb A1C   Action Suggested:  > 8.0% Hgb A1C   Ref:  Diabetes Care, 22, Suppl. 1, 1999    CBG: Recent Labs  Lab 01/29/2018 1701 01/03/18 [place] 140* [place], NP-C [facility] Pgr: [phone] or if no answer [phone] 01/04/2018, 8:20 AM  Attending Note:  62 year old female with large B cell lymphoma who is presents to PCCM with sepsis and fever who had additional neurologic symptoms overnight and is awaiting an MRI.  On exam, she is very weak with a weak cough and high risk of aspiration.  I reviewed CXR myself, infiltrate noted.  Discussed with PCCM-NP.  Will hold in the ICU given risk of respiratory failure and hypotension.  Appreciate input from ID, H/O and neurology.  Abx change as ordered.  PCCM will continue to follow.  The patient is critically ill with multiple organ systems failure and requires high complexity decision making for assessment and support, frequent evaluation and titration of therapies, application of advanced monitoring technologies and extensive interpretation of multiple databases.   Critical Care Time devoted to patient  care services described in this note is  33  Minutes. This time reflects time of care of this signee Dr Jennet Maduro. This critical care time does not reflect procedure time, or teaching time or supervisory time of PA/NP/Med student/Med Resident etc but could involve care discussion time.  Rush Farmer, M.D. [facility] Pulmonary/Critical Care Medicine. Pager: [phone]. After hours pager: [phone].

## 2018-01-04 NOTE — Progress Notes (Addendum)
Spoke w/ MRI to coordinate MRI brain needed for pt. Stated I will attempt to coordinate w/ SWOT RN.   Spoke w/ CCM NP re: pt unable to tolerate last MRI attempt. Per NP, not comfortable ordering anxiety med for MRI due to neuro and respiratory status.

## 2018-01-04 NOTE — Progress Notes (Signed)
PT Cancellation Note  Patient Details Name: JAMIAH HOMEYER MRN: [id number] DOB: Jan 01, 1956   Cancelled Treatment:    Reason Eval/Treat Not Completed: Other (comment).  Checked back in with RN (see OT note from earlier today) who states pt still has some pending testing and is a bit tenuous in her medical stability today (MRI pending, LE dopplers pending, very weak) and would recommend holding until tomorrow.  PT to check back tomorrow.  Thanks, Barbarann Ehlers. Arieanna Pressey, PT, DPT  Acute Rehabilitation [phone] pager [phone]) [phone] office     Wells Guiles B Kyndall Chaplin 01/04/2018, 2:56 PM

## 2018-01-04 NOTE — Evaluation (Signed)
Speech Language Pathology Evaluation Patient Details Name: [name] MRN: [id number] DOB: Oct 08, 1955 Today's Date: 01/04/2018 Time: 8366-2947 SLP Time Calculation (min) (ACUTE ONLY): 23 min  Problem List:  Patient Active Problem List   Diagnosis Date Noted  . Chemotherapy-induced neutropenia ([place])   . Non-STEMI (non-ST elevated myocardial infarction) ([place])   . LV dysfunction   . Neutropenic sepsis ([place]) 01/26/2018  . Displaced fracture of lateral malleolus of right fibula, subsequent encounter for closed fracture with routine healing    Past Medical History:  Past Medical History:  Diagnosis Date  . Ankle fracture   . Dementia ([place])   . History of blood transfusion   . Hypertension   . Left-sided weakness    post stroke  . Stroke [facility]) 2009   "6 light" "2 Major"- "I take medication for memory"   Past Surgical History:  Past Surgical History:  Procedure Laterality Date  . CATARACT EXTRACTION, BILATERAL    . HARDWARE REMOVAL Right 08/26/2016   Procedure: RIGHT ANKLE HARDWARE REMOVAL AND DEBRIDEMENT OF BONE;  Surgeon: Leandrew Koyanagi, MD;  Location: [place];  Service: Orthopedics;  Laterality: Right;  . ORIF ANKLE FRACTURE Right 04/15/2016   Procedure: OPEN REDUCTION INTERNAL FIXATION (ORIF) RIGHT LATERAL MALLEOLUS AND SYNDESMOSIS;  Surgeon: Leandrew Koyanagi, MD;  Location: [place];  Service: Orthopedics;  Laterality: Right;  . TUBAL LIGATION     HPI:  Pt is a 62 year old woman admitted with neutropenic sepsis 12/1 s/p unwitnessed fall at home. She has a h/o multiple strokes with some residual left-sided weakness and some L facial droop that acutely worsened, with code stroke called 12/2. CT Head was negative for acute changes; MRI pending. PMH also includes lymphoma s/p chemo, HTN, dementia   Assessment / Plan / Recommendation Clinical Impression  Pt is significantly hard of hearing and communicates using multimodal communication, including loud volume with spoken word, writing, and  some sign language. She responds primarily using gestures and writing, although with occasional vocalizations. When offered a sign language interpreter she declined, and she never used sign language to respond. Via written choices she describes baseline function, but cannot respond to open-ended questions. Her writing is often incomprehensible, although she does write her name clearaly x2 after a few attempts. She clearly wrote one other phrase during eval. Initially she needed Mod-Max cues for sustained attention, although this seemed to improve as eval continued. Pt makes attempts at following simple commands. She has limited emergent awareness of suspected acute impairments, including need for oral suction to manage POs. SLP will f/u to maximize communication, safety, and differential diagnosis of current abilities.    SLP Assessment  SLP Recommendation/Assessment: Patient needs continued Speech Lanaguage Pathology Services SLP Visit Diagnosis: Cognitive communication deficit (R41.841)    Follow Up Recommendations  (tba)    Frequency and Duration min 2x/week  2 weeks      SLP Evaluation Cognition  Overall Cognitive Status: No family/caregiver present to determine baseline cognitive functioning Arousal/Alertness: Awake/alert Orientation Level: Oriented to person Attention: Sustained Sustained Attention: Impaired Sustained Attention Impairment: Verbal basic;Functional basic Awareness: Impaired Awareness Impairment: Emergent impairment Problem Solving: Impaired Problem Solving Impairment: Functional basic       Comprehension  Auditory Comprehension Overall Auditory Comprehension: (see clinical impressions)    Expression Expression Primary Mode of Expression: Nonverbal - written Verbal Expression Overall Verbal Expression: Other (comment)(baseline unknown - see clinical impressions) Written Expression Written Expression: Exceptions to [facility] Self Formulation Ability: Word;Phrase    Oral / Motor  Oral Motor/Sensory Function Overall Oral Motor/Sensory Function: Moderate impairment Facial ROM: Reduced left;Suspected CN VII (facial) dysfunction Facial Symmetry: Abnormal symmetry left;Suspected CN VII (facial) dysfunction Facial Strength: Reduced left;Suspected CN VII (facial) dysfunction Facial Sensation: Reduced left;Suspected CN V (Trigeminal) dysfunction Lingual ROM: Reduced right;Reduced left Lingual Symmetry: Within Functional Limits Lingual Strength: Reduced Motor Speech Overall Motor Speech: Other (comment)(see clinical impressions)   GO                    Germain Osgood 01/04/2018, 10:08 AM  Germain Osgood, M.A. [place] Acute Environmental education officer [phone] Office [phone]

## 2018-01-04 NOTE — Progress Notes (Signed)
ELECTROENCEPHALOGRAM REPORT Date of Study: 01/04/18  MRN: [id number]   Clinical History: 62 year old female with PMH significant for: HTN, CVA with mild right sided weakness), Diffuse Large B-Cell Lymphoma, Systolic HF (EF 12%-16%). Who just completed last cycle of Chemo 11/20. Had been instructed to take Etoposide 3 tabs instead of 5 (but she took all 5). Did c/o watery diarrhea and sores on her mouth starting  11/29. Presented via EMS after being found on floor at home after an unwitnessed fall. On EMS arrival SBP 70s. In ER core temp 93.8, lactic acid 7.66, & hgb < 4.   Medications: NO AED  Technical Summary: A multichannel digital EEG recording measured by the international 10-20 system with electrodes applied with paste and impedances below 5000 ohms performed in our laboratory with EKG monitoring in an awake and asleep patient. No hyperventilation and photic stimulation were performed. The digital EEG was referentially recorded, reformatted, and digitally filtered in a variety of bipolar and referential montages for optimal display.  Description: The patient is somnolent and asleep during the recording. During maximal wakefulness, there is a symmetric, medium voltage 10 Hz posterior dominant rhythm that attenuates with eye opening. The record is asymmetric. During drowsiness and sleep, there is an increase in theta slowing of the background. Generalized slowing seen with increased focal slowing on the R hemispheric, some triphasic waves seen bilaterally.  There were no electrographic seizures seen.  EKG lead was unremarkable.  Impression:  This awake and asleep EEG is abnormal due to generalized slowing with focal R slowing that can be associated with structural lesion, triphasic waves that can represent metabolic encephalopathy without any electrographic seizures.

## 2018-01-05 ENCOUNTER — Inpatient Hospital Stay (HOSPITAL_COMMUNITY): Payer: Medicare Other

## 2018-01-05 ENCOUNTER — Other Ambulatory Visit (HOSPITAL_COMMUNITY): Payer: Medicare Other

## 2018-01-05 DIAGNOSIS — Z515 Encounter for palliative care: Secondary | ICD-10-CM

## 2018-01-05 DIAGNOSIS — I824Z1 Acute embolism and thrombosis of unspecified deep veins of right distal lower extremity: Secondary | ICD-10-CM

## 2018-01-05 DIAGNOSIS — I6340 Cerebral infarction due to embolism of unspecified cerebral artery: Secondary | ICD-10-CM

## 2018-01-05 LAB — CBC WITH DIFFERENTIAL/PLATELET
Abs Immature Granulocytes: 0.62 10*3/uL — ABNORMAL HIGH (ref 0.00–0.07)
Basophils Absolute: 0.1 10*3/uL (ref 0.0–0.1)
Basophils Relative: 1 %
Eosinophils Absolute: 0 10*3/uL (ref 0.0–0.5)
Eosinophils Relative: 0 %
HCT: 31.9 % — ABNORMAL LOW (ref 36.0–46.0)
Hemoglobin: 10.1 g/dL — ABNORMAL LOW (ref 12.0–15.0)
Immature Granulocytes: 8 %
Lymphocytes Relative: 6 %
Lymphs Abs: 0.4 10*3/uL — ABNORMAL LOW (ref 0.7–4.0)
MCH: 29.4 pg (ref 26.0–34.0)
MCHC: 31.7 g/dL (ref 30.0–36.0)
MCV: 92.7 fL (ref 80.0–100.0)
Monocytes Absolute: 0.8 10*3/uL (ref 0.1–1.0)
Monocytes Relative: 10 %
NEUTROS ABS: 5.7 10*3/uL (ref 1.7–7.7)
Neutrophils Relative %: 75 %
Platelets: 20 10*3/uL — CL (ref 150–400)
RBC: 3.44 MIL/uL — ABNORMAL LOW (ref 3.87–5.11)
RDW: 17.9 % — ABNORMAL HIGH (ref 11.5–15.5)
WBC: 7.6 10*3/uL (ref 4.0–10.5)
nRBC: 1.6 % — ABNORMAL HIGH (ref 0.0–0.2)

## 2018-01-05 LAB — COMPREHENSIVE METABOLIC PANEL
ALT: 31 U/L (ref 0–44)
AST: 67 U/L — ABNORMAL HIGH (ref 15–41)
Albumin: 1.8 g/dL — ABNORMAL LOW (ref 3.5–5.0)
Alkaline Phosphatase: 48 U/L (ref 38–126)
Anion gap: 10 (ref 5–15)
BUN: 50 mg/dL — ABNORMAL HIGH (ref 8–23)
CO2: 14 mmol/L — ABNORMAL LOW (ref 22–32)
Calcium: 8.3 mg/dL — ABNORMAL LOW (ref 8.9–10.3)
Chloride: 117 mmol/L — ABNORMAL HIGH (ref 98–111)
Creatinine, Ser: 1.47 mg/dL — ABNORMAL HIGH (ref 0.44–1.00)
GFR calc Af Amer: 44 mL/min — ABNORMAL LOW (ref >60)
GFR calc non Af Amer: 38 mL/min — ABNORMAL LOW (ref >60)
GLUCOSE: 230 mg/dL — AB (ref 70–99)
POTASSIUM: 4.2 mmol/L (ref 3.5–5.1)
Sodium: 141 mmol/L (ref 135–145)
Total Bilirubin: 2 mg/dL — ABNORMAL HIGH (ref 0.3–1.2)
Total Protein: 4.9 g/dL — ABNORMAL LOW (ref 6.5–8.1)

## 2018-01-05 LAB — CULTURE, BLOOD (ROUTINE X 2): Special Requests: ADEQUATE

## 2018-01-05 LAB — MAGNESIUM: Magnesium: 1.8 mg/dL (ref 1.7–2.4)

## 2018-01-05 MED ORDER — AMIODARONE HCL IN DEXTROSE 360-4.14 MG/200ML-% IV SOLN: 30.0000 mg/h | INTRAVENOUS | Status: DC

## 2018-01-05 MED ORDER — PHENYLEPHRINE HCL-NACL 10-0.9 MG/250ML-% IV SOLN
0.0000 ug/min | INTRAVENOUS | Status: DC
  Administered 2018-01-05: 20 ug/min via INTRAVENOUS
  Filled 2018-01-05: qty 250

## 2018-01-05 MED ORDER — ACETAMINOPHEN 325 MG PO TABS: 650.0000 mg | ORAL_TABLET | Freq: Four times a day (QID) | ORAL | Status: DC | PRN

## 2018-01-05 MED ORDER — AMIODARONE LOAD VIA INFUSION
150.0000 mg | Freq: Once | INTRAVENOUS | Status: AC
  Administered 2018-01-05: 150 mg via INTRAVENOUS

## 2018-01-05 MED ORDER — POLYVINYL ALCOHOL 1.4 % OP SOLN
1.0000 [drp] | Freq: Four times a day (QID) | OPHTHALMIC | Status: DC | PRN
  Filled 2018-01-05: qty 15

## 2018-01-05 MED ORDER — DIPHENHYDRAMINE HCL 50 MG/ML IJ SOLN: 25.0000 mg | INTRAMUSCULAR | Status: DC | PRN

## 2018-01-05 MED ORDER — AMIODARONE HCL IN DEXTROSE 360-4.14 MG/200ML-% IV SOLN
60.0000 mg/h | INTRAVENOUS | Status: DC
  Administered 2018-01-05: 60 mg/h via INTRAVENOUS
  Filled 2018-01-05: qty 200

## 2018-01-05 MED ORDER — FENTANYL 2500MCG IN NS 250ML (10MCG/ML) PREMIX INFUSION
0.0000 ug/h | INTRAVENOUS | Status: DC
  Administered 2018-01-05: 25 ug/h via INTRAVENOUS
  Administered 2018-01-07: 50 ug/h via INTRAVENOUS
  Filled 2018-01-05 (×4): qty 250

## 2018-01-05 MED ORDER — DEXTROSE 5 % IV SOLN: INTRAVENOUS | Status: DC

## 2018-01-05 MED ORDER — GLYCOPYRROLATE 0.2 MG/ML IJ SOLN
0.2000 mg | INTRAMUSCULAR | Status: DC | PRN
  Administered 2018-01-05: 0.2 mg via INTRAVENOUS

## 2018-01-05 MED ORDER — ACETAMINOPHEN 650 MG RE SUPP: 650.0000 mg | Freq: Four times a day (QID) | RECTAL | Status: DC | PRN

## 2018-01-05 MED ORDER — FENTANYL CITRATE (PF) 100 MCG/2ML IJ SOLN: 50.0000 ug | INTRAMUSCULAR | Status: DC | PRN

## 2018-01-05 MED ORDER — GLYCOPYRROLATE 0.2 MG/ML IJ SOLN
0.2000 mg | INTRAMUSCULAR | Status: DC | PRN
  Filled 2018-01-05: qty 1

## 2018-01-05 MED ORDER — MIDAZOLAM HCL 2 MG/2ML IJ SOLN
2.0000 mg | INTRAMUSCULAR | Status: DC | PRN
  Administered 2018-01-09: 2 mg via INTRAVENOUS
  Filled 2018-01-05: qty 2

## 2018-01-05 MED ORDER — MORPHINE SULFATE (PF) 2 MG/ML IV SOLN
2.0000 mg | INTRAVENOUS | Status: DC | PRN
  Administered 2018-01-05 – 2018-01-09 (×3): 2 mg via INTRAVENOUS
  Filled 2018-01-05 (×3): qty 1

## 2018-01-05 MED ORDER — FENTANYL BOLUS VIA INFUSION
100.0000 ug | INTRAVENOUS | Status: DC | PRN
  Administered 2018-01-07 – 2018-01-09 (×4): 100 ug via INTRAVENOUS
  Filled 2018-01-05: qty 100

## 2018-01-05 MED ORDER — GLYCOPYRROLATE 1 MG PO TABS
1.0000 mg | ORAL_TABLET | ORAL | Status: DC | PRN
  Filled 2018-01-05: qty 1

## 2018-01-05 MED ORDER — AMIODARONE HCL IN DEXTROSE 360-4.14 MG/200ML-% IV SOLN
INTRAVENOUS | Status: AC
  Filled 2018-01-05: qty 200

## 2018-01-05 NOTE — Progress Notes (Signed)
   01/05/18 0500  Clinical Encounter Type  Visited With Family;Health care provider;Patient and family together  Visit Type Initial;Patient actively dying;Psychological support;Spiritual support;Social support  Referral From Nurse  Spiritual Encounters  Spiritual Needs Ritual;Literature;Grief support;Emotional;Prayer  Stress Factors  Family Stress Factors Exhausted;Family relationships;Financial concerns;Major life changes;Loss of control;Lack of knowledge   Met w/ family outside room and bedside, w/ RNs present some of time.  Pt is DNR and is being made comfort care.  Empathetic listening and grief support for anticipatory grief.  Pt's 62 yo and 62 yo granddaughters were bedside part of the time and encouraged to tell pt anything they wished to.  Same for pt's daughter and son-in-law.  Normalized experiences and expressions of grief and sorrow.  [place] Dept for them to attempt to notify pt's son, who is reported to be estranged.  [facility] will call back to [phone] to update w/ outcome of notification.  Provided website information for funeral costs at all area funeral homes from [place] ([email or web address]).  Also provided [place] Dept # if they had add'l questions in response to financial concerns for cremation of pt after her death.  Let them also know that [facility] provides free grief support for children and adults.  Family very appreciative of chaplain support and for the care nurses have provided.  Chaplain remains available.  Will brief day shift spiritual care if pt is living at shift change.  Myra Gianotti resident, [phone]

## 2018-01-05 NOTE — Progress Notes (Signed)
eLink Physician-Brief Progress Note Patient Name: KADEDRA VANAKEN DOB: 28-Dec-1955 MRN: [id number]   Date of Service  01/05/2018  HPI/Events of Note  Spoke with daughter, Ambree Frances (who is deaf) through her husband. Imaan and her husband relate that given her mother's poor prognosis and clinical deterioration tonight they want to move to comfort measures and allow her mother to pass with comfort and dignity.   eICU Interventions  Will make patient comfort measures and order withdrawal of life sustaining therapy protocol.      Intervention Category Major Interventions: End of life / care limitation discussion  Lysle Dingwall 01/05/2018, 3:53 AM

## 2018-01-05 NOTE — Progress Notes (Addendum)
 NAME:  [name], MRN:  [id number], DOB:  Aug 20, 1955, LOS: 3 ADMISSION DATE:  01/08/2018, CONSULTATION DATE:  12/1 REFERRING MD:  Jeanell Sparrow, CHIEF COMPLAINT:  Circulatory shock   Brief History   62 year old female w/ large B cell Lymphoma last chemo completed 23rd. Admitted after being found on floor at home. In ER hypotensive. Hgb < 4. Concern for sepsis +/- GIB, neutropenic fever.  GNR in blood.    History of present illness   62 year old female w/ multiple co-morbids as listed below. Just completed last cycle of Chemo 11/20. Had been instructed to take Etoposide 3 tabs instead of 5 (but she took all 5). Did c/o watery diarrhea and sores on her mouth starting  11/29. Presented via EMS after being found on floor at home after an unwitnessed fall. On EMS arrival SBP 70s. In ER core temp 93.8, lactic acid 7.66, & hgb < 4. PCCM asked to admit.  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   Significant Hospital Events   12/01 Admitted w/ working dx of sepsis, colitis/ GIB  12/02 Urinary retention  12/03 Concern for CVA symptoms overnight  Consults:  Oncology  ID  Procedures:     Significant Diagnostic Tests:  CT Head / Neck 12/1 >> no acute abnormality, no acute process with cervical spine MRI Brain 12/3 >>   Micro Data:  Blood cultures x 2 12/1 >> GNR >> UC 12/1 >> negative   Antimicrobials: PER ID  Vanc 12/1 >> 12/3 meropenem 12/1 >> 12/2 Fluconazole 12/1 >> 12/1 Cefepime 12/1 >>12/4  Interim history/subjective:  Significant respiratory distress overnight as well as AF with RVR and SVT.  Long discussions with family.  Pt now DNR, full comfort care.  Resting comfortably on fent gtt     Objective   Blood pressure 96/66, pulse 97, temperature 98.8 F (37.1 C), temperature source Axillary, resp. rate 13, height 5\' 4"   (1.626 m), weight 66.5 kg, SpO2 100 %.        Intake/Output Summary (Last 24 hours) at 01/05/2018 0947 Last data filed at 01/05/2018 0800 Gross per 24 hour  Intake 2675.82 ml  Output 1036 ml  Net 1639.82 ml   Filed Weights   01/03/18 0400 01/04/18 0500 01/05/18 0500  Weight: 64.8 kg 65.7 kg 66.5 kg    Examination: General: chronically ill appearing female, NAD in bed on fentanyl gtt HEENT: mm moist, no JVD, venti mask  Neuro: lethargic, tries to open eyes to loud voice but minimal interaction, on fent gtt for comfort  CV: s1s2 rrr, no m/r/g, right chest wall port  PULM: resps even non labored on venti mask, coarse  NO:BSJG, non-tender, bsx4 active  Extremities: warm/dry, no edema  Skin: no rashes or lesions  Resolved Hospital Problem list   Shock   Assessment & Plan:   Neutropenic Fever   -shock resolved with IVF  -GNR in blood, ? Right perihilar infiltrate Elevated Troponin -likely due to severe anemia, peak at 7.18 Acute/subacute blood loss anemia  -presume colitis (Chemo induced vs ischemic vs infective colitis), negative CT abd Pancytopenia s/p chemo H/o systolic CM (ef 28-36%) AKI -Baseline cr 1.35  Anion gap metabolic acidosis/lactic acidosis  Large B cell Lymphoma  -last chemo was 11/20 and 11/23 Afib with RVR/ SVT  Acute Respiratory failure  PLAN -  Now full comfort care  No further labs/ xrays  Continue low dose fentanyl gtt - titrate as needed for comfort  PRN zofran, Rubinol Ok to tx to floor  Anticipate hospital death in 24-48 hours   Will ask TRH to assume care 12/3   Best practice:  Diet: NPO Pain/Anxiety/Delirium protocol (if indicated): na VAP protocol (if indicated): na DVT prophylaxis: SCD GI prophylaxis: PPI Glucose control: ssi  Mobility: bedrest Code Status: full code  Family Communication: No family available at bedside 12/4.   Disposition: tx med surg   Labs   CBC: Recent Labs  Lab 01/07/2018 1247 01/27/2018 2049  01/03/18 0650 01/03/18 8315 01/03/18 1652 01/04/18 0213 01/04/18 0517 01/05/18 0121  WBC 0.4* 0.3* 0.6*  --   --   --  3.1* 7.6  NEUTROABS  --   --   --   --   --   --  2.3 5.7  HGB 3.3* 8.8* 8.7* 8.7* 9.4* 9.7* 9.5* 10.1*  HCT 11.2* 27.2* 26.5* 27.9* 29.1* 28.9* 29.4* 31.9*  MCV 97.4 90.4 89.2  --   --   --  89.1 92.7  PLT 32* 27* 25*  --   --   --  23* 20*    Basic Metabolic Panel: Recent Labs  Lab  1247 01/23/2018 1748 01/03/18 0650 01/04/18 0517 01/05/18 0114 01/05/18 0121  NA 130* 132* 137 139  --  141  K 3.1* 3.0* 2.9* 3.0*  --  4.2  CL 98 103 107 112*  --  117*  CO2 14* 21* 20* 16*  --  14*  GLUCOSE 212* 169* 117* 146*  --  230*  BUN 43* 43* 42* 43*  --  50*  CREATININE 2.48* 2.02* 1.84* 1.62*  --  1.47*  CALCIUM 8.2* 7.4* 7.9* 8.2*  --  8.3*  MG  --  2.0 2.0 2.0 1.8  --   PHOS  --   --  3.4 2.7  --   --    GFR: Estimated Creatinine Clearance: 37.2 mL/min (A) (by C-G formula based on SCr of 1.47 mg/dL (H)). Recent Labs  Lab 01/23/2018 1255 01/18/2018 1441 01/17/2018 1748 01/08/2018 1749 01/17/2018 2049 01/03/18 0650 01/04/18 0517 01/05/18 0121  PROCALCITON  --   --  61.17  --   --   --   --   --   WBC  --   --   --   --  0.3* 0.6* 3.1* 7.6  LATICACIDVEN 7.66* 3.91*  --  2.2*  --  1.4  --   --     Liver Function Tests: Recent Labs  Lab 01/16/2018 1247 01/05/18 0121  AST 63* 67*  ALT 33 31  ALKPHOS 44 48  BILITOT 1.8* 2.0*  PROT 5.7* 4.9*  ALBUMIN 2.1* 1.8*   No results for input(s): LIPASE, AMYLASE in the last 168 hours. No results for input(s): AMMONIA in the last 168 hours.  ABG    Component Value Date/Time   PHART 7.403 01/03/2018 0444   PCO2ART 21.1 (L) 01/03/2018 0444   PO2ART 78.0 (L) 01/03/2018 0444   HCO3 13.2 (L) 01/03/2018 0444   TCO2 14 (L) 01/03/2018 0444   ACIDBASEDEF 9.0 (H) 01/03/2018 0444   O2SAT 96.0 01/03/2018 0444     Coagulation Profile: Recent Labs  Lab 01/13/2018 1748  INR 1.46    Cardiac Enzymes: Recent Labs   Lab 01/17/2018 2049 01/03/18 0650 01/03/18 0822 01/03/18 1503 01/03/18 2059  TROPONINI 4.57* 7.18*  7.13*  6.72* 4.81* 2.51*    HbA1C: Hgb A1c MFr Bld  Date/Time Value Ref Range Status  01/04/2018 05:17 AM 6.0 (H) 4.8 - 5.6 % Final    Comment:    (NOTE) Pre diabetes:          5.7%-6.4% Diabetes:              >6.4% Glycemic control for   <7.0% adults with diabetes   03/15/2007 03:40 AM   Final   5.8 (NOTE)   The ADA recommends the following therapeutic goals for glycemic   control related to Hgb A1C measurement:   Goal of Therapy:   < 7.0% Hgb A1C   Action Suggested:  > 8.0% Hgb A1C   Ref:  Diabetes Care, 22, Suppl. 1, 1999    CBG: Recent Labs  Lab 01/10/2018 1701 01/03/18 1854 01/04/18 2115  GLUCAP 140* [place]    Nickolas Madrid, NP 01/05/2018  9:47 AM Pager: [phone] or [phone]  Attending Note:  62 year old female with diffuse B cell lymphoma presenting with respiratory failure and sepsis from PNA.  Patient deteriorated overnight and was seen by overnight team and made DNR.  On exam, she is barely arousable and not following commands.  I reviewed CXR myself, infiltrate noted.  Conversation with family and with RN staff, will proceed with comfort care.  Fentanyl drip.  Full DNR.  Transfer to palliative care floor and to [facility] service with PCCM off 12/5.  The patient is critically ill with multiple organ systems failure and requires high complexity decision making for assessment and support, frequent evaluation and titration of therapies, application of advanced monitoring technologies and extensive interpretation of multiple databases.   Critical Care Time devoted to patient care services described in this note is  35  Minutes. This time reflects time of care of this signee Dr Jennet Maduro. This critical care time does not reflect procedure time, or teaching time or supervisory time of PA/NP/Med student/Med Resident etc but could involve care discussion  time.  Rush Farmer, M.D. [facility] Pulmonary/Critical Care Medicine. Pager: [phone]. After hours pager: [phone].

## 2018-01-05 NOTE — Progress Notes (Signed)
eLink Physician-Brief Progress Note Patient Name: [name] DOB: 1955/03/28 MRN: [id number]   Date of Service  01/05/2018  HPI/Events of Note  Episode of SVT with rate related BBB. Patient now in sinus tachycardia with rate = 118  eICU Interventions  Will order: 1. Amiodarone IV load and infusion.  2. Send morning CBC and CMP STAT. 3. Mg++ level STAT.      Intervention Category Major Interventions: Arrhythmia - evaluation and management  [name] 01/05/2018, 1:07 AM

## 2018-01-05 NOTE — Progress Notes (Signed)
MRI was canceled due to patient being unstable at this time.

## 2018-01-05 NOTE — CV Procedure (Signed)
2D echo attempted, but patient was placed on comfort care. Per RN.

## 2018-01-05 NOTE — Progress Notes (Signed)
 STROKE TEAM PROGRESS NOTE   SUBJECTIVE (INTERVAL HISTORY) Her 3 family members are at bedside. Pt on fetanyl drip for comfort care measure. Overnight event noted, pt had respiratory distress and then became unresponsive. CCM discussed with family and pt now in comfort care measures.    OBJECTIVE Temp:  [98.3 F (36.8 C)-98.8 F (37.1 C)] 98.8 F (37.1 C) (12/03 2300) Pulse Rate:  [87-122] 94 (12/04 1000) Cardiac Rhythm: Normal sinus rhythm (12/04 0800) Resp:  [9-33] 9 (12/04 1000) BP: (87-138)/(51-97) 113/84 (12/04 1000) SpO2:  [95 %-100 %] 100 % (12/04 1000) Weight:  [66.5 kg] 66.5 kg (12/04 0500)  Recent Labs  Lab 01/10/2018 1701 01/03/18 1854 01/04/18 2115  GLUCAP 140* 170* 153*   Recent Labs  Lab  1247 01/28/2018 1748 01/03/18 0650 01/04/18 0517 01/05/18 0114 01/05/18 0121  NA 130* 132* 137 139  --  141  K 3.1* 3.0* 2.9* 3.0*  --  4.2  CL 98 103 107 112*  --  117*  CO2 14* 21* 20* 16*  --  14*  GLUCOSE 212* 169* 117* 146*  --  230*  BUN 43* 43* 42* 43*  --  50*  CREATININE 2.48* 2.02* 1.84* 1.62*  --  1.47*  CALCIUM 8.2* 7.4* 7.9* 8.2*  --  8.3*  MG  --  2.0 2.0 2.0 1.8  --   PHOS  --   --  3.4 2.7  --   --    Recent Labs  Lab 01/08/2018 1247 01/05/18 0121  AST 63* 67*  ALT 33 31  ALKPHOS 44 48  BILITOT 1.8* 2.0*  PROT 5.7* 4.9*  ALBUMIN 2.1* 1.8*   Recent Labs  Lab  1247 01/07/2018 2049 01/03/18 0650 01/03/18 0822 01/03/18 1652 01/04/18 0213 01/04/18 0517 01/05/18 0121  WBC 0.4* 0.3* 0.6*  --   --   --  3.1* 7.6  NEUTROABS  --   --   --   --   --   --  2.3 5.7  HGB 3.3* 8.8* 8.7* 8.7* 9.4* 9.7* 9.5* 10.1*  HCT 11.2* 27.2* 26.5* 27.9* 29.1* 28.9* 29.4* 31.9*  MCV 97.4 90.4 89.2  --   --   --  89.1 92.7  PLT 32* 27* 25*  --   --   --  23* 20*   Recent Labs  Lab 01/16/2018 2049 01/03/18 0650 01/03/18 0822 01/03/18 1503 01/03/18 2059  TROPONINI 4.57* 7.18*  7.13* 6.72* 4.81* 2.51*   Recent Labs    01/12/2018 1748  LABPROT 17.5*   INR 1.46   Recent Labs    01/10/2018 1831  COLORURINE YELLOW  LABSPEC 1.017  PHURINE 5.0  GLUCOSEU 50*  HGBUR LARGE*  BILIRUBINUR NEGATIVE  KETONESUR NEGATIVE  PROTEINUR 30*  NITRITE NEGATIVE  LEUKOCYTESUR NEGATIVE       Component Value Date/Time   CHOL 80 01/04/2018 0517   TRIG 153 (H) 01/04/2018 0517   HDL <10 (L) 01/04/2018 0517   CHOLHDL NOT CALCULATED 01/04/2018 0517   VLDL 31 01/04/2018 0517   LDLCALC NOT CALCULATED 01/04/2018 0517   Lab Results  Component Value Date   HGBA1C 6.0 (H) 01/04/2018      Component Value Date/Time   LABOPIA NONE DETECTED 03/14/2007 2212   COCAINSCRNUR NONE DETECTED 03/14/2007 2212   LABBENZ NONE DETECTED 03/14/2007 2212   AMPHETMU NONE DETECTED 03/14/2007 2212   THCU NONE DETECTED 03/14/2007 2212   LABBARB  03/14/2007 2212    NONE DETECTED        DRUG  SCREEN FOR MEDICAL PURPOSES ONLY.  IF CONFIRMATION IS NEEDED FOR ANY PURPOSE, NOTIFY LAB WITHIN 5 DAYS.    No results for input(s): ETH in the last 168 hours.  I have personally reviewed the radiological images below and agree with the radiology interpretations.  Ct Abdomen Pelvis Wo Contrast  Result Date: 01/26/2018 CLINICAL DATA:  Left-sided weakness. Recent chemotherapy for lymphoma presents today post fall. Found this morning on ground. EXAM: CT CHEST, ABDOMEN AND PELVIS WITHOUT CONTRAST TECHNIQUE: Multidetector CT imaging of the chest, abdomen and pelvis was performed following the standard protocol without IV contrast. COMPARISON:  MRI pelvis 12/27/2017 FINDINGS: CT CHEST FINDINGS Cardiovascular: Right IJ Port-A-Cath has tip in the SVC. Heart is normal size. Mild calcified plaque over the thoracic aorta. Mild calcified plaque over the left main and 3 vessel coronary arteries. Remaining vascular structures are unremarkable. Mediastinum/Nodes: No evidence of mediastinal or hilar adenopathy. Remaining mediastinal structures are unremarkable. Lungs/Pleura: Lungs are adequately inflated  without focal airspace consolidation or effusion. There is minimal bibasilar dependent atelectasis. Airways are normal. Musculoskeletal: Degenerative change of the spine. CT ABDOMEN PELVIS FINDINGS Hepatobiliary: Liver and biliary tree are normal. Gallbladder is within normal. Pancreas: Normal. Spleen: Normal. Adrenals/Urinary Tract: Adrenal glands are normal. Kidneys normal size without hydronephrosis or nephrolithiasis. Couple small calcifications over the renal hilum likely vascular. 2.2 cm cyst over the mid pole right kidney. Ureters and bladder are normal. Stomach/Bowel: Stomach and small bowel are normal. Appendix is normal. Colon is normal. Vascular/Lymphatic: Mild-to-moderate calcified plaque over the abdominal aorta. No adenopathy. Reproductive: Uterus and left ovary are normal. Solid right adnexal mass measuring 4.1 x 6.5 cm unchanged from recent MRI. Other: No free fluid or focal inflammatory change. Musculoskeletal: Degenerative change of the spine. No lytic/sclerotic lesions. IMPRESSION: No acute findings in the chest, abdomen or pelvis. Known solid right adnexal mass measuring 4.1 x 6.5 cm unchanged from recent MRI and likely neoplastic in nature. No adenopathy. Continue to recommend surgical consultation. 2.2 cm right renal cyst. Aortic Atherosclerosis (ICD10-I70.0). Atherosclerotic coronary artery disease. Electronically Signed   By: Marin Olp M.D.   On: 01/07/2018 16:55   Dg Chest 1 View  Result Date: 01/03/2018 CLINICAL DATA:  62 year old female with shortness of breath. EXAM: CHEST  1 VIEW COMPARISON:  Chest radiograph dated 01/26/2018 FINDINGS: Right pectoral infusion catheter with tip over central SVC in similar position. Slight interstitial prominence may represent mild edema. Bilateral perihilar and left infrahilar streaky densities may represent atelectasis although infiltrate is not excluded. There is no pleural effusion or pneumothorax. There is mild cardiomegaly. Atherosclerotic  calcification of the aortic arch. No acute osseous pathology. Degenerative changes of the spine. IMPRESSION: 1. Mild cardiomegaly with possible mild edema. 2. Bilateral perihilar and left infrahilar atelectasis versus infiltrate. Electronically Signed   By: Anner Crete M.D.   On: 01/03/2018 21:21   Dg Pelvis 1-2 Views  Result Date: 01/14/2018 CLINICAL DATA:  Fall EXAM: PELVIS - 1-2 VIEW COMPARISON:  None. FINDINGS: There is no evidence of pelvic fracture or diastasis. No pelvic bone lesions are seen. IMPRESSION: Negative. Electronically Signed   By: Franchot Gallo M.D.   On: 01/06/2018 13:01   Ct Head Wo Contrast  Result Date: 01/27/2018 CLINICAL DATA:  Left-sided weakness with recent fall history of lymphoma EXAM: CT HEAD WITHOUT CONTRAST CT CERVICAL SPINE WITHOUT CONTRAST TECHNIQUE: Multidetector CT imaging of the head and cervical spine was performed following the standard protocol without intravenous contrast. Multiplanar CT image reconstructions of the cervical spine were  also generated. COMPARISON:  MRI 03/14/2007 FINDINGS: CT HEAD FINDINGS Brain: No acute territorial infarction, hemorrhage or intracranial mass. Mild atrophy. Mild small vessel ischemic changes of the white matter. Chronic lacunar infarct in the left basal ganglia. Stable ventricle size. Vascular: No hyperdense vessels.  Carotid vascular calcification Skull: Normal. Negative for fracture or focal lesion. Sinuses/Orbits: No acute orbital abnormality. Moderate mucosal opacification of the ethmoid sinuses. Complete opacification of right maxillary sinus with small air bubbles. Mucosal thickening left maxillary sinus. Extension of opacifications into the right nasal passage. Other: None CT CERVICAL SPINE FINDINGS Alignment: Straightening of the cervical spine. No subluxation. Facet alignment within normal limits. Skull base and vertebrae: No acute fracture. No primary bone lesion or focal pathologic process. Soft tissues and spinal  canal: No prevertebral fluid or swelling. No visible canal hematoma. Disc levels:  Mild degenerative changes at C4-C5 and C5-C6. Upper chest: Negative. Other: None IMPRESSION: 1. No CT evidence for acute intracranial abnormality. Atrophy and small vessel ischemic changes of the white matter. Chronic lacunar infarct in the left basal ganglia 2. No acute osseous abnormality of the cervical spine 3. Paranasal sinus disease Electronically Signed   By: Donavan Foil M.D.   On: 01/11/2018 16:52   Ct Chest Wo Contrast  Result Date: 01/10/2018 CLINICAL DATA:  Left-sided weakness. Recent chemotherapy for lymphoma presents today post fall. Found this morning on ground. EXAM: CT CHEST, ABDOMEN AND PELVIS WITHOUT CONTRAST TECHNIQUE: Multidetector CT imaging of the chest, abdomen and pelvis was performed following the standard protocol without IV contrast. COMPARISON:  MRI pelvis 12/27/2017 FINDINGS: CT CHEST FINDINGS Cardiovascular: Right IJ Port-A-Cath has tip in the SVC. Heart is normal size. Mild calcified plaque over the thoracic aorta. Mild calcified plaque over the left main and 3 vessel coronary arteries. Remaining vascular structures are unremarkable. Mediastinum/Nodes: No evidence of mediastinal or hilar adenopathy. Remaining mediastinal structures are unremarkable. Lungs/Pleura: Lungs are adequately inflated without focal airspace consolidation or effusion. There is minimal bibasilar dependent atelectasis. Airways are normal. Musculoskeletal: Degenerative change of the spine. CT ABDOMEN PELVIS FINDINGS Hepatobiliary: Liver and biliary tree are normal. Gallbladder is within normal. Pancreas: Normal. Spleen: Normal. Adrenals/Urinary Tract: Adrenal glands are normal. Kidneys normal size without hydronephrosis or nephrolithiasis. Couple small calcifications over the renal hilum likely vascular. 2.2 cm cyst over the mid pole right kidney. Ureters and bladder are normal. Stomach/Bowel: Stomach and small bowel are  normal. Appendix is normal. Colon is normal. Vascular/Lymphatic: Mild-to-moderate calcified plaque over the abdominal aorta. No adenopathy. Reproductive: Uterus and left ovary are normal. Solid right adnexal mass measuring 4.1 x 6.5 cm unchanged from recent MRI. Other: No free fluid or focal inflammatory change. Musculoskeletal: Degenerative change of the spine. No lytic/sclerotic lesions. IMPRESSION: No acute findings in the chest, abdomen or pelvis. Known solid right adnexal mass measuring 4.1 x 6.5 cm unchanged from recent MRI and likely neoplastic in nature. No adenopathy. Continue to recommend surgical consultation. 2.2 cm right renal cyst. Aortic Atherosclerosis (ICD10-I70.0). Atherosclerotic coronary artery disease. Electronically Signed   By: Marin Olp M.D.   On: 01/27/2018 16:55   Ct Cervical Spine Wo Contrast  Result Date: 01/15/2018 CLINICAL DATA:  Left-sided weakness with recent fall history of lymphoma EXAM: CT HEAD WITHOUT CONTRAST CT CERVICAL SPINE WITHOUT CONTRAST TECHNIQUE: Multidetector CT imaging of the head and cervical spine was performed following the standard protocol without intravenous contrast. Multiplanar CT image reconstructions of the cervical spine were also generated. COMPARISON:  MRI 03/14/2007 FINDINGS: CT HEAD FINDINGS Brain: No  acute territorial infarction, hemorrhage or intracranial mass. Mild atrophy. Mild small vessel ischemic changes of the white matter. Chronic lacunar infarct in the left basal ganglia. Stable ventricle size. Vascular: No hyperdense vessels.  Carotid vascular calcification Skull: Normal. Negative for fracture or focal lesion. Sinuses/Orbits: No acute orbital abnormality. Moderate mucosal opacification of the ethmoid sinuses. Complete opacification of right maxillary sinus with small air bubbles. Mucosal thickening left maxillary sinus. Extension of opacifications into the right nasal passage. Other: None CT CERVICAL SPINE FINDINGS Alignment:  Straightening of the cervical spine. No subluxation. Facet alignment within normal limits. Skull base and vertebrae: No acute fracture. No primary bone lesion or focal pathologic process. Soft tissues and spinal canal: No prevertebral fluid or swelling. No visible canal hematoma. Disc levels:  Mild degenerative changes at C4-C5 and C5-C6. Upper chest: Negative. Other: None IMPRESSION: 1. No CT evidence for acute intracranial abnormality. Atrophy and small vessel ischemic changes of the white matter. Chronic lacunar infarct in the left basal ganglia 2. No acute osseous abnormality of the cervical spine 3. Paranasal sinus disease Electronically Signed   By: Donavan Foil M.D.   On: 01/20/2018 16:52   Dg Chest Port 1 View  Result Date: 01/20/2018 CLINICAL DATA:  Fall.  History of lymphoma EXAM: PORTABLE CHEST 1 VIEW COMPARISON:  Chest two-view 03/15/2007 FINDINGS: Heart size mildly enlarged. Negative for heart failure. Atherosclerotic aortic arch. Lungs are clear without infiltrate or effusion. Port-A-Cath tip in the SVC. IMPRESSION: No active disease. Electronically Signed   By: Franchot Gallo M.D.   On: 01/12/2018 13:00   Dg Knee Left Port  Result Date: 01/20/2018 CLINICAL DATA:  Fall EXAM: PORTABLE LEFT KNEE - 1-2 VIEW COMPARISON:  None. FINDINGS: No evidence of fracture, dislocation, or joint effusion. No evidence of arthropathy or other focal bone abnormality. Soft tissues are unremarkable. IMPRESSION: Negative. Electronically Signed   By: Franchot Gallo M.D.   On: 01/29/2018 13:01   Dg Knee Right Port  Result Date: 01/05/2018 CLINICAL DATA:  Fall EXAM: PORTABLE RIGHT KNEE - 1-2 VIEW COMPARISON:  None. FINDINGS: No evidence of fracture, dislocation, or joint effusion. No evidence of arthropathy or other focal bone abnormality. Soft tissues are unremarkable. IMPRESSION: Negative. Electronically Signed   By: Franchot Gallo M.D.   On: 01/31/2018 13:06   Ct Head Code Stroke Wo Contrast  Result Date:  01/03/2018 CLINICAL DATA:  Code stroke.  62 y/o  F; left facial droop. EXAM: CT HEAD WITHOUT CONTRAST TECHNIQUE: Contiguous axial images were obtained from the base of the skull through the vertex without intravenous contrast. COMPARISON:  01/11/2018 CT head. FINDINGS: Brain: No evidence of acute infarction, hemorrhage, hydrocephalus, extra-axial collection or mass lesion/mass effect. Small chronic infarcts are present within the bilateral caudate bodies and the left corona radiata. Few nonspecific white matter hypodensities compatible with chronic microvascular ischemic changes are stable. Stable mild volume loss of the brain. Vascular: Calcific atherosclerosis of carotid siphons and vertebral arteries. No hyperdense vessel identified. Skull: Normal. Negative for fracture or focal lesion. Sinuses/Orbits: Extensive paranasal sinus disease with complete opacification of right maxillary sinus. Normal aeration of the mastoid air cells. Bilateral intra-ocular lens replacement. Other: None. ASPECTS [facility] Stroke Program Early CT Score) - Ganglionic level infarction (caudate, lentiform nuclei, internal capsule, insula, M1-M3 cortex): 7 - Supraganglionic infarction (M4-M6 cortex): 3 Total score (0-10 with 10 being normal): 10 IMPRESSION: 1. No acute intracranial abnormality identified. Stable CT of the head. 2. ASPECTS is 10 3. Stable chronic microvascular ischemic changes and  volume loss of the brain. Stable bilateral chronic lacunar infarcts in the basal ganglia. These results were communicated to Dr. Lorraine Lax at [place] 12/2/2019by text page via the [facility] messaging system. Electronically Signed   By: Kristine Garbe M.D.   On: 01/03/2018 19:17    PHYSICAL EXAM  Temp:  [98.3 F (36.8 C)-98.8 F (37.1 C)] 98.8 F (37.1 C) (12/03 2300) Pulse Rate:  [87-122] 94 (12/04 1000) Resp:  [9-33] 9 (12/04 1000) BP: (87-138)/(51-97) 113/84 (12/04 1000) SpO2:  [95 %-100 %] 100 % (12/04 1000) Weight:  [66.5 kg]  66.5 kg (12/04 0500)  General - moderate nourished, well developed, not in distress on fentanyl drip  Ophthalmologic - fundi not visualized due to noncooperation  Cardiovascular - Regular rhythm, intermittent tachycardia.  Neuro - limited due to comfort care measures. Eyes closed not responsive to voice. Breathing comfortably on fentanyl drip. No spontaneous movement in all extremities. Sensation, coordination and gait not tested.   ASSESSMENT/PLAN Ms. LAWRIE TUNKS is a 62 y.o. 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 No tPA given due to thrombocytopenia.    Stroke suspected: right cortical infarct possible. Etiology unclear, could be due to hypotension with severe anemia, endocarditis, paradoxical emboli given DVT, hypercoagulable state due to malignancy or severe sepsis. However, CNS metastasis, CNS infection and undiagnosed afib are still in DDx.   Resultant anarthria, and left facial droop -> respiratory distress and unresponsive  MRI / MRA cancelled due to comfort care  Carotid Doppler unremarkable   2D Echo cancelled due to comfort care  LE venous doppler right LE DVT  EEG triphasic waves consistent with encephalopathy  Cancel TEE to rule out endocarditis and PFO due to comfort care measures  LDL low not able to calculate  HgbA1c 6.0  SCDs for VTE prophylaxis  aspirin 325 mg daily and clopidogrel 75 mg daily prior to admission  Overnight had respiratory distress and then became unresponsive. CCM discussed with family and pt now in comfort care measures.  Severe sepsis and septic shock  Neutropenia sepsis on admission  Blood culture positive for G- rods  On cefepime, vancomycin and acyclovir  CCM on board  Cancel TEE to rule out endocarditis due to comfort care  Severe anemia, thrombocytopenia  and neutropenia   Likely due to chemo  Hb 3.3 s/p PRBC->9.5->10.1  Platelet 32->25->23->20  WBC 0.3->0.6->3.1->7.6  Hypotension Hx of hypertension . SBP 80-90s on admission  . S/p fluid resuscitation  Stable on the low side  AKI  Baseline 0.84  This time Cre 2.48->2.02->1.84->1.62->1.47  Other Stroke Risk Factors  Coronary artery disease / MI  CHF   Hx of stroke 2009 with mild right sided weakness  Other Active Problems  Lymphoma s/p chemo  Dementia   Scleral jaundice with INR 1.46 but normal AST/ALT  Elevated troponin - down trending  Hospital day # 3  Neurology will sign off. Please call with questions. Thanks for the consult.   Rosalin Hawking, MD PhD Stroke Neurology 01/05/2018 12:17 PM    To contact Stroke Continuity provider, please refer to [email or web address] After hours, contact General Neurology

## 2018-01-05 NOTE — Progress Notes (Signed)
Report given to the receiving RN on 6NT.

## 2018-01-05 NOTE — Progress Notes (Signed)
Patient's heart rate suddenly went into an episode of SVT. Elink was immediately contacted and RN obtained an EKG. Patient is less responsive and pale in color with more labored breathing. She seems to hold on to secretions in the back of her throat with copious amount out when suctioned with [place]. Elink MD notified ground team and they are currently at bedside. Amiodarone was stopped due to BP getting soft. Patient is currently on a Neosynephrine drip with improving BP. She is in normal sinus with 02 at 100% on a non-rebreather. However, she continues to have labored breathing and still less responsive. Family was called and notified about patient's condition.

## 2018-01-05 NOTE — Progress Notes (Signed)
PMT consult received from [facility] physician overnight. Extensive conversations had with family overnight by [facility] provider regarding patient's status and goals of care. Patient transitioned to comfort care by PCCM. No family at bedside now. Spoke with Valetta Fuller, NP regarding patient - palliative team will shadow chart for now as goals are clear and patient is comfortable. Follow up tomorrow.  Thank you for this consult.  Juel Burrow, DNP, AGNP-C Palliative Medicine Team Team Phone # [phone]  Pager # [phone]  NO CHARGE

## 2018-01-05 NOTE — Progress Notes (Signed)
PT Cancellation Note  Patient Details Name: HEIDI MACLIN MRN: [id number] DOB: 1955-06-15   Cancelled Treatment:    Reason Eval/Treat Not Completed: Other (comment)(per chart patient transitioned to comfort care) D/C orders acknowledged 12/4.    Reinaldo Berber, PT, DPT Acute Rehabilitation Services Pager: [phone] Office: [place] 01/05/2018, 11:17 AM

## 2018-01-05 NOTE — Progress Notes (Addendum)
PCCM INTERVAL PROGRESS NOTE  Called to bedside by EMD to evaluate patient in acute distress. She developed tachycardia described as AF RVR vs SVT. She was given amiodarone, which initially improved her HR, however, then she dropped her BP and became unresponsive. Upon my arrival she was unresponsive with SBP in the 90s and paradoxical respirations. Pooled secretions in her posterior oropharynx. Her advanced directive would indicated that she would not want to be intubated, which is absolutely indicated at this time. Amiodarone stopped and phenylephrine started. BP beginning to improve and she is in somewhat less distress, but she remains paradoxical and poorly responsive. She has possibly experienced another ischemic event, or her hypotension has exaggerated a former stroke.   Blood pressure (!) 129/94, pulse (!) 122, temperature 98.8 F (37.1 C), temperature source Axillary, resp. rate (!) 22, height 5\' 4"  (1.626 m), weight 65.7 kg, SpO2 100 %.  I have discussed this change with family and have indicated that if she does not improve soon, without intubation, it is unlikely she will survive this event. They wish to change code status to DNR and have given me permission to provide comfort medications if indicated before they arrive.   If she has not improved by the time they arrive, they will likely pursue a transition to full comfort care.    Georgann Housekeeper, AGACNP-BC [place] Pager [phone] or [phone]  01/05/2018 2:16 AM

## 2018-01-06 DIAGNOSIS — Z515 Encounter for palliative care: Secondary | ICD-10-CM

## 2018-01-06 DIAGNOSIS — I6340 Cerebral infarction due to embolism of unspecified cerebral artery: Secondary | ICD-10-CM

## 2018-01-06 DIAGNOSIS — Z7189 Other specified counseling: Secondary | ICD-10-CM

## 2018-01-06 LAB — BPAM RBC
BLOOD PRODUCT EXPIRATION DATE: 201912292359
BLOOD PRODUCT EXPIRATION DATE: 201912292359
Blood Product Expiration Date: 201912042359
Blood Product Expiration Date: 201912052359
Blood Product Expiration Date: 201912292359
Blood Product Expiration Date: 201912292359
ISSUE DATE / TIME: 201912011440
ISSUE DATE / TIME: 201912011440
ISSUE DATE / TIME: 201912011815
Unit Type and Rh: 5100
Unit Type and Rh: 5100
Unit Type and Rh: 5100
Unit Type and Rh: 5100
Unit Type and Rh: 9500
Unit Type and Rh: 9500

## 2018-01-06 LAB — TYPE AND SCREEN
ABO/RH(D): O POS
Antibody Screen: NEGATIVE
UNIT DIVISION: 0
Unit division: 0
Unit division: 0
Unit division: 0
Unit division: 0
Unit division: 0

## 2018-01-06 NOTE — Social Work (Signed)
CSW acknowledging consult for comfort care. Will follow for disposition should hospice services or residential hospice become appropriate.   Corie Allis H Concettina Leth, LCSWA [place] Clinical Social Work [phone]   

## 2018-01-06 NOTE — Progress Notes (Signed)
Patient Demographics:    [name], is a 62 y.o. female, DOB - 1955-04-20, MWN:[id number]  Admit date - 01/28/2018   Admitting Physician Rush Farmer, MD  Outpatient Primary MD for the patient is Hayden Rasmussen, MD  LOS - 4   Chief Complaint  Patient presents with  . Fall        Subjective:    [name] today has no fevers, no emesis,  No chest pain, patient's son and daughter-in-law at bedside, RN at bedside, patient resting comfortably, unresponsive to verbal or tactile stimuli  Assessment  & Plan :    Active Problems:   Neutropenic sepsis (HCC)   Chemotherapy-induced neutropenia (HCC)   Non-STEMI (non-ST elevated myocardial infarction) (HCC)   LV dysfunction   Acute respiratory failure with hypoxia (HCC)   Sepsis with acute renal failure (HCC)   SOB (shortness of breath)   Cerebral embolism with cerebral infarction  Brief summary:- Ms. KELLIS TOPETE is a 62 y.o. 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 No tPA given due to thrombocytopenia.    Patient is now comfort measures only  Plan:- 1) neutropenic fever with Citrobacter Freundi sepsis----remains unresponsive, per family request she is now comfort measures only  2) suspected acute stroke possibly right cortical infarct----patient is hypercoagulable due to underlying malignancy, also had significant anemia with hemoglobin less than 4 , as well as persistent hypotension, cannot rule out CNS metastases, MRI not done due to hemodynamic and cardiopulmonary instability-family has decided to make patient comfort measures only at this time.  Neurology input appreciated  3) anemia and thrombocytopenia----no further transfusions at this time  patient is comfort measures only  Social/Ethics--- at the request of patient's daughter who is POA and patient's on a daughter-in-law fentanyl IV drip rate is been decreased to see if patient will wake up enough to interact with family one last time...Marland KitchenMarland KitchenMarland Kitchen patient is a DNR/DNI, anticipating Death within hours to days,  Disposition/Need for in-Hospital Stay- patient unable to be discharged at this time due to patient awaiting possible transfer to residential hospice house  Code Status : DNR   Disposition Plan  : Residential hospice home  Consults  :  ID/oncology and neurology/PCCM   DVT Prophylaxis  :  hospice  Lab Results  Component Value Date   PLT 20 (LL) 01/05/2018    Inpatient Medications  Scheduled Meds: Continuous Infusions: . fentaNYL infusion INTRAVENOUS 25 mcg/hr (01/06/18 1443)   PRN Meds:.acetaminophen **OR** acetaminophen, diphenhydrAMINE, fentaNYL, glycopyrrolate **OR** glycopyrrolate **OR** glycopyrrolate, midazolam, morphine injection, ondansetron (ZOFRAN) IV, polyvinyl alcohol    Anti-infectives (From admission, onward)   Start     Dose/Rate Route Frequency Ordered Stop   01/05/18 1600  acyclovir (ZOVIRAX) 200 MG/5ML suspension SUSP 400 mg  Status:  Discontinued     400 mg Oral 3 times daily 01/04/18 1010 01/04/18 1022   01/04/18 1100  acyclovir (ZOVIRAX) 400 mg in dextrose 5 % 100 mL IVPB  Status:  Discontinued     400 mg 108 mL/hr over 60 Minutes Intravenous Every 8 hours 01/04/18 1009 01/05/18  3614   01/04/18 1100  ceFEPIme (MAXIPIME) 2 g in sodium chloride 0.9 % 100 mL IVPB  Status:  Discontinued     2 g 200 mL/hr over 30 Minutes Intravenous Every 12 hours 01/04/18 1052 01/05/18 0842   01/03/18 1600  vancomycin (VANCOCIN) IVPB 750 mg/150 ml premix  Status:  Discontinued     750 mg 150 mL/hr over 60 Minutes Intravenous Every 24 hours 01/24/2018 1512 01/04/18 0946   01/03/18 1600  ceFEPIme (MAXIPIME) 2 g in sodium chloride 0.9 % 100 mL IVPB  Status:   Discontinued     2 g 200 mL/hr over 30 Minutes Intravenous Every 24 hours 01/03/18 1017 01/04/18 1052   01/03/18 1130  acyclovir (ZOVIRAX) 200 MG/5ML suspension SUSP 400 mg  Status:  Discontinued     400 mg Oral 3 times daily 01/03/18 1124 01/04/18 1010   01/03/18 0500  meropenem (MERREM) 1 g in sodium chloride 0.9 % 100 mL IVPB  Status:  Discontinued     1 g 200 mL/hr over 30 Minutes Intravenous Every 12 hours 01/30/2018 1628 01/03/18 1017   01/12/2018 2100  ceFEPIme (MAXIPIME) 1 g in sodium chloride 0.9 % 100 mL IVPB  Status:  Discontinued     1 g 200 mL/hr over 30 Minutes Intravenous Every 8 hours 01/16/2018 1512 01/12/2018 1533   01/28/2018 2000  fluconazole (DIFLUCAN) IVPB 400 mg  Status:  Discontinued     400 mg 100 mL/hr over 120 Minutes Intravenous Every 24 hours 01/25/2018 1809 01/03/18 1017   01/23/2018 1700  meropenem (MERREM) 2 g in sodium chloride 0.9 % 100 mL IVPB     2 g 200 mL/hr over 30 Minutes Intravenous  Once 01/08/2018 1620 01/23/2018 2226   01/30/2018 1230  ceFEPIme (MAXIPIME) 2 g in sodium chloride 0.9 % 100 mL IVPB     2 g 200 mL/hr over 30 Minutes Intravenous  Once 01/30/2018 1224 01/07/2018 1337   01/25/2018 1230  metroNIDAZOLE (FLAGYL) IVPB 500 mg  Status:  Discontinued     500 mg 100 mL/hr over 60 Minutes Intravenous Every 8 hours 01/25/2018 1224 01/15/2018 1533   01/04/2018 1230  vancomycin (VANCOCIN) IVPB 1000 mg/200 mL premix     1,000 mg 200 mL/hr over 60 Minutes Intravenous  Once 01/07/2018 1224 01/27/2018 1845        Objective:   Vitals:   01/05/18 1600 01/05/18 1900 01/05/18 2000 01/06/18 0559  BP: 106/78 100/76  111/77  Pulse: 94 (!) 102 (!) 111 (!) 118  Resp: (!) 9 (!) 8 10   Temp:    (!) 100.8 F (38.2 C)  TempSrc:    Oral  SpO2: 99% 99% 100% 100%  Weight:      Height:        Wt Readings from Last 3 Encounters:  01/05/18 66.5 kg  08/26/16 67.2 kg  04/07/16 70.3 kg     Intake/Output Summary (Last 24 hours) at 01/06/2018 1709 Last data filed at 01/06/2018 1330 Gross  per 24 hour  Intake 18.03 ml  Output 350 ml  Net -331.97 ml     Physical Exam Patient is examined daily including today on 01/06/18 , exams remain the same as of yesterday except that has changed   Gen:-Unresponsive, appears comfortable on fentanyl drip  HEENT:- [place].AT, conjunctiva edema Lungs-diminished bilaterally, no wheezing CV- S1, S2 normal, regular  Abd-  +ve B.Sounds, Abd Soft, No groans/Moans on palpation tenderness,    Extremity/Skin:- No  edema, pedal pulses present  Neuro/Psych-unresponsive,  Data Review:   Micro Results Recent Results (from the past 240 hour(s))  Blood culture (routine x 2)     Status: Abnormal   Collection Time: 01/26/2018 12:48 PM  Result Value Ref Range Status   Specimen Description BLOOD PORTA CATH  Final   Special Requests   Final    BOTTLES DRAWN AEROBIC AND ANAEROBIC Blood Culture adequate volume   Culture  Setup Time   Final    GRAM NEGATIVE RODS ANAEROBIC BOTTLE ONLY CRITICAL RESULT CALLED TO, READ BACK BY AND VERIFIED WITH: Hughie Closs PHARMD, AT 0802 01/04/18 BY Rush Landmark Performed at [facility], [place] [address], [place], [place] [address]    Culture CITROBACTER FREUNDII (A)  Final   Report Status 01/05/2018 FINAL  Final   Organism ID, Bacteria CITROBACTER FREUNDII  Final      Susceptibility   Citrobacter freundii - MIC*    CEFAZOLIN >=64 RESISTANT Resistant     CEFEPIME <=1 SENSITIVE Sensitive     CEFTAZIDIME <=1 SENSITIVE Sensitive     CEFTRIAXONE <=1 SENSITIVE Sensitive     CIPROFLOXACIN <=0.25 SENSITIVE Sensitive     GENTAMICIN <=1 SENSITIVE Sensitive     IMIPENEM 4 SENSITIVE Sensitive     TRIMETH/SULFA <=20 SENSITIVE Sensitive     PIP/TAZO <=4 SENSITIVE Sensitive     * CITROBACTER FREUNDII  Blood culture (routine x 2)     Status: None (Preliminary result)   Collection Time: 01/18/2018  2:32 PM  Result Value Ref Range Status   Specimen Description BLOOD RIGHT ANTECUBITAL  Final   Special Requests   Final    BOTTLES DRAWN  AEROBIC ONLY Blood Culture adequate volume   Culture   Final    NO GROWTH 4 DAYS Performed at [facility], [address], [place], [place] [address]    Report Status PENDING  Incomplete  Urine culture     Status: None   Collection Time: 01/21/2018  3:32 PM  Result Value Ref Range Status   Specimen Description URINE, CATHETERIZED  Final   Special Requests NONE  Final   Culture   Final    NO GROWTH Performed at [facility], [place] [address] [place], [place] [address]    Report Status 01/03/2018 FINAL  Final  MRSA PCR Screening     Status: Abnormal   Collection Time: 01/15/2018  7:27 PM  Result Value Ref Range Status   MRSA by PCR POSITIVE (A) NEGATIVE Final    Comment:        The GeneXpert MRSA Assay (FDA approved for NASAL specimens only), is one component of a comprehensive MRSA colonization surveillance program. It is not intended to diagnose MRSA infection nor to guide or monitor treatment for MRSA infections. RESULT CALLED TO, READ BACK BY AND VERIFIED WITH: [place] 2344 01/05/2018 MITCHELL,L Performed at [facility], [place] [address] [place],  [address]   Gastrointestinal Panel by PCR , Stool     Status: None   Collection Time: 01/04/18  9:48 AM  Result Value Ref Range Status   Campylobacter species NOT DETECTED NOT DETECTED Final   Plesimonas shigelloides NOT DETECTED NOT DETECTED Final   Salmonella species NOT DETECTED NOT DETECTED Final   Yersinia enterocolitica NOT DETECTED NOT DETECTED Final   Vibrio species NOT DETECTED NOT DETECTED Final   Vibrio cholerae NOT DETECTED NOT DETECTED Final   Enteroaggregative E coli (EAEC) NOT DETECTED NOT DETECTED Final   Enteropathogenic E coli (EPEC)  NOT DETECTED NOT DETECTED Final   Enterotoxigenic E coli (ETEC) NOT DETECTED NOT DETECTED Final   Shiga like toxin producing E coli (STEC) NOT DETECTED NOT DETECTED Final   Shigella/Enteroinvasive E coli (EIEC) NOT DETECTED NOT DETECTED Final    Cryptosporidium NOT DETECTED NOT DETECTED Final   Cyclospora cayetanensis NOT DETECTED NOT DETECTED Final   Entamoeba histolytica NOT DETECTED NOT DETECTED Final   Giardia lamblia NOT DETECTED NOT DETECTED Final   Adenovirus F40/41 NOT DETECTED NOT DETECTED Final   Astrovirus NOT DETECTED NOT DETECTED Final   Norovirus GI/GII NOT DETECTED NOT DETECTED Final   Rotavirus A NOT DETECTED NOT DETECTED Final   Sapovirus (I, II, IV, and V) NOT DETECTED NOT DETECTED Final    Comment: Performed at [facility], [address] [place], [place] [address]    Radiology Reports Ct Abdomen Pelvis Wo Contrast  Result Date: 01/10/2018 CLINICAL DATA:  Left-sided weakness. Recent chemotherapy for lymphoma presents today post fall. Found this morning on ground. EXAM: CT CHEST, ABDOMEN AND PELVIS WITHOUT CONTRAST TECHNIQUE: Multidetector CT imaging of the chest, abdomen and pelvis was performed following the standard protocol without IV contrast. COMPARISON:  MRI pelvis 12/27/2017 FINDINGS: CT CHEST FINDINGS Cardiovascular: Right IJ Port-A-Cath has tip in the SVC. Heart is normal size. Mild calcified plaque over the thoracic aorta. Mild calcified plaque over the left main and 3 vessel coronary arteries. Remaining vascular structures are unremarkable. Mediastinum/Nodes: No evidence of mediastinal or hilar adenopathy. Remaining mediastinal structures are unremarkable. Lungs/Pleura: Lungs are adequately inflated without focal airspace consolidation or effusion. There is minimal bibasilar dependent atelectasis. Airways are normal. Musculoskeletal: Degenerative change of the spine. CT ABDOMEN PELVIS FINDINGS Hepatobiliary: Liver and biliary tree are normal. Gallbladder is within normal. Pancreas: Normal. Spleen: Normal. Adrenals/Urinary Tract: Adrenal glands are normal. Kidneys normal size without hydronephrosis or nephrolithiasis. Couple small calcifications over the renal hilum likely vascular. 2.2 cm cyst over  the mid pole right kidney. Ureters and bladder are normal. Stomach/Bowel: Stomach and small bowel are normal. Appendix is normal. Colon is normal. Vascular/Lymphatic: Mild-to-moderate calcified plaque over the abdominal aorta. No adenopathy. Reproductive: Uterus and left ovary are normal. Solid right adnexal mass measuring 4.1 x 6.5 cm unchanged from recent MRI. Other: No free fluid or focal inflammatory change. Musculoskeletal: Degenerative change of the spine. No lytic/sclerotic lesions. IMPRESSION: No acute findings in the chest, abdomen or pelvis. Known solid right adnexal mass measuring 4.1 x 6.5 cm unchanged from recent MRI and likely neoplastic in nature. No adenopathy. Continue to recommend surgical consultation. 2.2 cm right renal cyst. Aortic Atherosclerosis (ICD10-I70.0). Atherosclerotic coronary artery disease. Electronically Signed   By: Marin Olp M.D.   On: 01/27/2018 16:55   Dg Chest 1 View  Result Date: 01/03/2018 CLINICAL DATA:  62 year old female with shortness of breath. EXAM: CHEST  1 VIEW COMPARISON:  Chest radiograph dated 01/04/2018 FINDINGS: Right pectoral infusion catheter with tip over central SVC in similar position. Slight interstitial prominence may represent mild edema. Bilateral perihilar and left infrahilar streaky densities may represent atelectasis although infiltrate is not excluded. There is no pleural effusion or pneumothorax. There is mild cardiomegaly. Atherosclerotic calcification of the aortic arch. No acute osseous pathology. Degenerative changes of the spine. IMPRESSION: 1. Mild cardiomegaly with possible mild edema. 2. Bilateral perihilar and left infrahilar atelectasis versus infiltrate. Electronically Signed   By: Anner Crete M.D.   On: 01/03/2018 21:21   Dg Pelvis 1-2 Views  Result Date: 01/21/2018 CLINICAL DATA:  Fall EXAM:  PELVIS - 1-2 VIEW COMPARISON:  None. FINDINGS: There is no evidence of pelvic fracture or diastasis. No pelvic bone lesions are  seen. IMPRESSION: Negative. Electronically Signed   By: Franchot Gallo M.D.   On: 01/06/2018 13:01   Ct Head Wo Contrast  Result Date: 01/23/2018 CLINICAL DATA:  Left-sided weakness with recent fall history of lymphoma EXAM: CT HEAD WITHOUT CONTRAST CT CERVICAL SPINE WITHOUT CONTRAST TECHNIQUE: Multidetector CT imaging of the head and cervical spine was performed following the standard protocol without intravenous contrast. Multiplanar CT image reconstructions of the cervical spine were also generated. COMPARISON:  MRI 03/14/2007 FINDINGS: CT HEAD FINDINGS Brain: No acute territorial infarction, hemorrhage or intracranial mass. Mild atrophy. Mild small vessel ischemic changes of the white matter. Chronic lacunar infarct in the left basal ganglia. Stable ventricle size. Vascular: No hyperdense vessels.  Carotid vascular calcification Skull: Normal. Negative for fracture or focal lesion. Sinuses/Orbits: No acute orbital abnormality. Moderate mucosal opacification of the ethmoid sinuses. Complete opacification of right maxillary sinus with small air bubbles. Mucosal thickening left maxillary sinus. Extension of opacifications into the right nasal passage. Other: None CT CERVICAL SPINE FINDINGS Alignment: Straightening of the cervical spine. No subluxation. Facet alignment within normal limits. Skull base and vertebrae: No acute fracture. No primary bone lesion or focal pathologic process. Soft tissues and spinal canal: No prevertebral fluid or swelling. No visible canal hematoma. Disc levels:  Mild degenerative changes at C4-C5 and C5-C6. Upper chest: Negative. Other: None IMPRESSION: 1. No CT evidence for acute intracranial abnormality. Atrophy and small vessel ischemic changes of the white matter. Chronic lacunar infarct in the left basal ganglia 2. No acute osseous abnormality of the cervical spine 3. Paranasal sinus disease Electronically Signed   By: Donavan Foil M.D.   On: 01/05/2018 16:52   Ct Chest Wo  Contrast  Result Date: 01/03/2018 CLINICAL DATA:  Left-sided weakness. Recent chemotherapy for lymphoma presents today post fall. Found this morning on ground. EXAM: CT CHEST, ABDOMEN AND PELVIS WITHOUT CONTRAST TECHNIQUE: Multidetector CT imaging of the chest, abdomen and pelvis was performed following the standard protocol without IV contrast. COMPARISON:  MRI pelvis 12/27/2017 FINDINGS: CT CHEST FINDINGS Cardiovascular: Right IJ Port-A-Cath has tip in the SVC. Heart is normal size. Mild calcified plaque over the thoracic aorta. Mild calcified plaque over the left main and 3 vessel coronary arteries. Remaining vascular structures are unremarkable. Mediastinum/Nodes: No evidence of mediastinal or hilar adenopathy. Remaining mediastinal structures are unremarkable. Lungs/Pleura: Lungs are adequately inflated without focal airspace consolidation or effusion. There is minimal bibasilar dependent atelectasis. Airways are normal. Musculoskeletal: Degenerative change of the spine. CT ABDOMEN PELVIS FINDINGS Hepatobiliary: Liver and biliary tree are normal. Gallbladder is within normal. Pancreas: Normal. Spleen: Normal. Adrenals/Urinary Tract: Adrenal glands are normal. Kidneys normal size without hydronephrosis or nephrolithiasis. Couple small calcifications over the renal hilum likely vascular. 2.2 cm cyst over the mid pole right kidney. Ureters and bladder are normal. Stomach/Bowel: Stomach and small bowel are normal. Appendix is normal. Colon is normal. Vascular/Lymphatic: Mild-to-moderate calcified plaque over the abdominal aorta. No adenopathy. Reproductive: Uterus and left ovary are normal. Solid right adnexal mass measuring 4.1 x 6.5 cm unchanged from recent MRI. Other: No free fluid or focal inflammatory change. Musculoskeletal: Degenerative change of the spine. No lytic/sclerotic lesions. IMPRESSION: No acute findings in the chest, abdomen or pelvis. Known solid right adnexal mass measuring 4.1 x 6.5 cm  unchanged from recent MRI and likely neoplastic in nature. No adenopathy. Continue to recommend surgical  consultation. 2.2 cm right renal cyst. Aortic Atherosclerosis (ICD10-I70.0). Atherosclerotic coronary artery disease. Electronically Signed   By: Marin Olp M.D.   On: 01/24/2018 16:55   Ct Cervical Spine Wo Contrast  Result Date: 01/20/2018 CLINICAL DATA:  Left-sided weakness with recent fall history of lymphoma EXAM: CT HEAD WITHOUT CONTRAST CT CERVICAL SPINE WITHOUT CONTRAST TECHNIQUE: Multidetector CT imaging of the head and cervical spine was performed following the standard protocol without intravenous contrast. Multiplanar CT image reconstructions of the cervical spine were also generated. COMPARISON:  MRI 03/14/2007 FINDINGS: CT HEAD FINDINGS Brain: No acute territorial infarction, hemorrhage or intracranial mass. Mild atrophy. Mild small vessel ischemic changes of the white matter. Chronic lacunar infarct in the left basal ganglia. Stable ventricle size. Vascular: No hyperdense vessels.  Carotid vascular calcification Skull: Normal. Negative for fracture or focal lesion. Sinuses/Orbits: No acute orbital abnormality. Moderate mucosal opacification of the ethmoid sinuses. Complete opacification of right maxillary sinus with small air bubbles. Mucosal thickening left maxillary sinus. Extension of opacifications into the right nasal passage. Other: None CT CERVICAL SPINE FINDINGS Alignment: Straightening of the cervical spine. No subluxation. Facet alignment within normal limits. Skull base and vertebrae: No acute fracture. No primary bone lesion or focal pathologic process. Soft tissues and spinal canal: No prevertebral fluid or swelling. No visible canal hematoma. Disc levels:  Mild degenerative changes at C4-C5 and C5-C6. Upper chest: Negative. Other: None IMPRESSION: 1. No CT evidence for acute intracranial abnormality. Atrophy and small vessel ischemic changes of the white matter. Chronic lacunar  infarct in the left basal ganglia 2. No acute osseous abnormality of the cervical spine 3. Paranasal sinus disease Electronically Signed   By: Donavan Foil M.D.   On: 01/11/2018 16:52   Dg Chest Port 1 View  Result Date: 01/08/2018 CLINICAL DATA:  Fall.  History of lymphoma EXAM: PORTABLE CHEST 1 VIEW COMPARISON:  Chest two-view 03/15/2007 FINDINGS: Heart size mildly enlarged. Negative for heart failure. Atherosclerotic aortic arch. Lungs are clear without infiltrate or effusion. Port-A-Cath tip in the SVC. IMPRESSION: No active disease. Electronically Signed   By: Franchot Gallo M.D.   On: 01/24/2018 13:00   Dg Knee Left Port  Result Date: 01/30/2018 CLINICAL DATA:  Fall EXAM: PORTABLE LEFT KNEE - 1-2 VIEW COMPARISON:  None. FINDINGS: No evidence of fracture, dislocation, or joint effusion. No evidence of arthropathy or other focal bone abnormality. Soft tissues are unremarkable. IMPRESSION: Negative. Electronically Signed   By: Franchot Gallo M.D.   On: 01/18/2018 13:01   Dg Knee Right Port  Result Date: 01/21/2018 CLINICAL DATA:  Fall EXAM: PORTABLE RIGHT KNEE - 1-2 VIEW COMPARISON:  None. FINDINGS: No evidence of fracture, dislocation, or joint effusion. No evidence of arthropathy or other focal bone abnormality. Soft tissues are unremarkable. IMPRESSION: Negative. Electronically Signed   By: Franchot Gallo M.D.   On: 01/04/2018 13:06   Ct Head Code Stroke Wo Contrast  Result Date: 01/03/2018 CLINICAL DATA:  Code stroke.  62 y/o  F; left facial droop. EXAM: CT HEAD WITHOUT CONTRAST TECHNIQUE: Contiguous axial images were obtained from the base of the skull through the vertex without intravenous contrast. COMPARISON:  01/08/2018 CT head. FINDINGS: Brain: No evidence of acute infarction, hemorrhage, hydrocephalus, extra-axial collection or mass lesion/mass effect. Small chronic infarcts are present within the bilateral caudate bodies and the left corona radiata. Few nonspecific white matter  hypodensities compatible with chronic microvascular ischemic changes are stable. Stable mild volume loss of the brain. Vascular: Calcific atherosclerosis of  carotid siphons and vertebral arteries. No hyperdense vessel identified. Skull: Normal. Negative for fracture or focal lesion. Sinuses/Orbits: Extensive paranasal sinus disease with complete opacification of right maxillary sinus. Normal aeration of the mastoid air cells. Bilateral intra-ocular lens replacement. Other: None. ASPECTS [facility] Stroke Program Early CT Score) - Ganglionic level infarction (caudate, lentiform nuclei, internal capsule, insula, M1-M3 cortex): 7 - Supraganglionic infarction (M4-M6 cortex): 3 Total score (0-10 with 10 being normal): 10 IMPRESSION: 1. No acute intracranial abnormality identified. Stable CT of the head. 2. ASPECTS is 10 3. Stable chronic microvascular ischemic changes and volume loss of the brain. Stable bilateral chronic lacunar infarcts in the basal ganglia. These results were communicated to Dr. Lorraine Lax at [place] 12/2/2019by text page via the [facility] messaging system. Electronically Signed   By: Kristine Garbe M.D.   On: 01/03/2018 19:17   Vas US Carotid  Result Date: 01/05/2018 Carotid Arterial Duplex Study Indications: CVA. Performing Technologist: Maudry Mayhew MHA, RDMS, RVT, RDCS  Examination Guidelines: A complete evaluation includes B-mode imaging, spectral Doppler, color Doppler, and power Doppler as needed of all accessible portions of each vessel. Bilateral testing is considered an integral part of a complete examination. Limited examinations for reoccurring indications may be performed as noted.  Right Carotid Findings: +----------+-------+-------+--------+------------------------+-----------------+           PSV    EDV    StenosisDescribe                Comments                    cm/s   cm/s                                                      +----------+-------+-------+--------+------------------------+-----------------+ CCA Prox  59     11                                                       +----------+-------+-------+--------+------------------------+-----------------+ CCA Distal46     14                                     intimal                                                                   thickening        +----------+-------+-------+--------+------------------------+-----------------+ ICA Prox  64     29             heterogenous and                                                          irregular                                 +----------+-------+-------+--------+------------------------+-----------------+  ICA Distal60     24             smooth and heterogenous                   +----------+-------+-------+--------+------------------------+-----------------+ ECA       97     13             smooth and heterogenous                   +----------+-------+-------+--------+------------------------+-----------------+ +----------+--------+-------+----------------+-------------------+           PSV cm/sEDV cmsDescribe        Arm Pressure (mmHG) +----------+--------+-------+----------------+-------------------+ Subclavian               Multiphasic, WNL                    +----------+--------+-------+----------------+-------------------+ +---------+--------+--+--------+--+---------+ VertebralPSV cm/s45EDV cm/s13Antegrade +---------+--------+--+--------+--+---------+  Left Carotid Findings: +----------+-------+-------+--------+---------------------------------+--------+           PSV    EDV    StenosisDescribe                         Comments           cm/s   cm/s                                                     +----------+-------+-------+--------+---------------------------------+--------+ CCA Prox  91     19             homogeneous and smooth                     +----------+-------+-------+--------+---------------------------------+--------+ CCA Distal36     17             smooth and heterogenous                   +----------+-------+-------+--------+---------------------------------+--------+ ICA Prox  78     32             irregular, heterogenous and                                               calcific                                  +----------+-------+-------+--------+---------------------------------+--------+ ICA Distal73     28                                                       +----------+-------+-------+--------+---------------------------------+--------+ ECA       70     9                                                        +----------+-------+-------+--------+---------------------------------+--------+ +----------+--------+--------+----------------+-------------------+ SubclavianPSV cm/sEDV cm/sDescribe  Arm Pressure (mmHG) +----------+--------+--------+----------------+-------------------+           83              Multiphasic, WNL                    +----------+--------+--------+----------------+-------------------+ +---------+--------+--+--------+--+---------+ VertebralPSV cm/s44EDV cm/s15Antegrade +---------+--------+--+--------+--+---------+  Summary: Right Carotid: Velocities in the right ICA are consistent with a 1-39% stenosis. Left Carotid: Velocities in the left ICA are consistent with a 1-39% stenosis. Vertebrals:  Bilateral vertebral arteries demonstrate antegrade flow. Subclavians: Normal flow hemodynamics were seen in bilateral subclavian              arteries. *See table(s) above for measurements and observations.  Electronically signed by Antony Contras MD on 01/05/2018 at 8:42:00 AM.    Final    Vas [place] Lower Extremity Venous (dvt)  Result Date: 01/05/2018  Lower Venous Study Indications: Stroke.  Performing Technologist: Maudry Mayhew MHA, RDMS, RVT, RDCS  Examination  Guidelines: A complete evaluation includes B-mode imaging, spectral Doppler, color Doppler, and power Doppler as needed of all accessible portions of each vessel. Bilateral testing is considered an integral part of a complete examination. Limited examinations for reoccurring indications may be performed as noted.  Right Venous Findings: +---------+---------------+---------+-----------+----------+-------------------+          CompressibilityPhasicitySpontaneityPropertiesSummary             +---------+---------------+---------+-----------+----------+-------------------+ CFV      Full           Yes      Yes                                      +---------+---------------+---------+-----------+----------+-------------------+ SFJ      Full                                                             +---------+---------------+---------+-----------+----------+-------------------+ FV Prox  Full                                                             +---------+---------------+---------+-----------+----------+-------------------+ FV Mid   Full                                                             +---------+---------------+---------+-----------+----------+-------------------+ FV DistalFull                                                             +---------+---------------+---------+-----------+----------+-------------------+ PFV      Full                                                             +---------+---------------+---------+-----------+----------+-------------------+  POP      Full           Yes      Yes                                      +---------+---------------+---------+-----------+----------+-------------------+ PTV      Full                                                             +---------+---------------+---------+-----------+----------+-------------------+ PERO     None                                         Acute                +---------+---------------+---------+-----------+----------+-------------------+ GSV      None                                         At saphenofemoral                                                         junction            +---------+---------------+---------+-----------+----------+-------------------+  Left Venous Findings: +---------+---------------+---------+-----------+----------+-------------------+          CompressibilityPhasicitySpontaneityPropertiesSummary             +---------+---------------+---------+-----------+----------+-------------------+ CFV      Full           Yes      Yes                                      +---------+---------------+---------+-----------+----------+-------------------+ SFJ      Full                                                             +---------+---------------+---------+-----------+----------+-------------------+ FV Prox  Full                                                             +---------+---------------+---------+-----------+----------+-------------------+ FV Mid   Full                                                             +---------+---------------+---------+-----------+----------+-------------------+  FV DistalFull                                                             +---------+---------------+---------+-----------+----------+-------------------+ PFV      Full                                                             +---------+---------------+---------+-----------+----------+-------------------+ POP      Full           Yes      Yes                                      +---------+---------------+---------+-----------+----------+-------------------+ PTV      Full                                                             +---------+---------------+---------+-----------+----------+-------------------+ PERO     Full                                                              +---------+---------------+---------+-----------+----------+-------------------+ GSV      None                                         0.6cm from                                                                saphenofemoral                                                            junction            +---------+---------------+---------+-----------+----------+-------------------+    Summary: Right: Findings consistent with acute deep vein thrombosis involving the right peroneal vein. Findings consistent with acute superficial vein thrombosis involving the right great saphenous vein at the saphenofemoral junction. No cystic structure found in  the popliteal fossa. Left: Findings consistent with acute superficial vein thrombosis involving the left great saphenous vein 0.6cm away from the saphenofemoral junction. There is no evidence of deep vein thrombosis in the lower extremity. No cystic structure found in the popliteal fossa.  *See table(s)  above for measurements and observations. Electronically signed by Quay Burow MD on 01/05/2018 at 4:58:32 PM.    Final      CBC Recent Labs  Lab 01/08/2018 1247 01/23/2018 2049 01/03/18 5329 01/03/18 9242 01/03/18 1652 01/04/18 0213 01/04/18 0517 01/05/18 0121  WBC 0.4* 0.3* 0.6*  --   --   --  3.1* 7.6  HGB 3.3* 8.8* 8.7* 8.7* 9.4* 9.7* 9.5* 10.1*  HCT 11.2* 27.2* 26.5* 27.9* 29.1* 28.9* 29.4* 31.9*  PLT 32* 27* 25*  --   --   --  23* 20*  MCV 97.4 90.4 89.2  --   --   --  89.1 92.7  MCH 28.7 29.2 29.3  --   --   --  28.8 29.4  MCHC 29.5* 32.4 32.8  --   --   --  32.3 31.7  RDW 17.3* 15.6* 17.2*  --   --   --  17.5* 17.9*  LYMPHSABS  --   --   --   --   --   --  0.1* 0.4*  MONOABS  --   --   --   --   --   --  0.4 0.8  EOSABS  --   --   --   --   --   --  0.0 0.0  BASOSABS  --   --   --   --   --   --  0.0 0.1    Chemistries  Recent Labs  Lab 01/07/2018 1247 01/20/2018 1748 01/03/18 0650 01/04/18 0517  01/05/18 0114 01/05/18 0121  NA 130* 132* 137 139  --  141  K 3.1* 3.0* 2.9* 3.0*  --  4.2  CL 98 103 107 112*  --  117*  CO2 14* 21* 20* 16*  --  14*  GLUCOSE 212* 169* 117* 146*  --  230*  BUN 43* 43* 42* 43*  --  50*  CREATININE 2.48* 2.02* 1.84* 1.62*  --  1.47*  CALCIUM 8.2* 7.4* 7.9* 8.2*  --  8.3*  MG  --  2.0 2.0 2.0 1.8  --   AST 63*  --   --   --   --  67*  ALT 33  --   --   --   --  31  ALKPHOS 44  --   --   --   --  48  BILITOT 1.8*  --   --   --   --  2.0*   ------------------------------------------------------------------------------------------------------------------ Recent Labs    01/04/18 0517  CHOL 80  HDL <10*  LDLCALC NOT CALCULATED  TRIG 153*  CHOLHDL NOT CALCULATED    Lab Results  Component Value Date   HGBA1C 6.0 (H) 01/04/2018   ---------------------------------------------------------------------------------- Coagulation profile Recent Labs  Lab 01/28/2018 1748  INR 1.46    No results for input(s): DDIMER in the last 72 hours.  Cardiac Enzymes Recent Labs  Lab 01/03/18 0822 01/03/18 1503 01/03/18 2059  TROPONINI 6.72* 4.81* 2.51*   ------------------------------------------------------------------------------------------------------------------ No results found for: BNP   Roxan Hockey M.D on 01/06/2018 at 5:09 PM  Pager---[phone] Go to [email or web address] - password [email or web address] for contact info  [facility] - Office  [phone]

## 2018-01-06 NOTE — Consult Note (Signed)
Consultation Note Date: 01/06/2018   Patient Name: [name]  DOB: May 20, 1955  MRN: [id number]  Age / Sex: 62 y.o., female  PCP: Hayden Rasmussen, MD Referring Physician: Roxan Hockey, MD  Reason for Consultation: Hospice Evaluation, Psychosocial/spiritual support and Terminal Care  HPI/Patient Profile: 62 y.o. female  with past medical history of HTN, CVA w/ residual R side weakness, diffuse large B-cell lymphoma, systolic heart failure (EF 35-40%), and HOH admitted on 01/16/2018 after being found on the floor at home after an unwitnessed fall. Patient had just completed last cycle of chemo 11/20. Diagnosed with sepsis.  During hospitalization patient had acute stroke. Discussions had between family and PCCM - family opted to focus on patient's comfort. PMT consulted to assist with comfort care and hospice evaluation.   Clinical Assessment and Goals of Care: I have reviewed medical records including EPIC notes, labs and imaging, received report from RN, assessed the patient and then met with patient's daughter, [name],  to discuss diagnosis prognosis, [place], EOL wishes, disposition and options.  Also, met patient's son and DIL; however, [name] who is HCPOA requests that medical information not be discussed with patient's son.   I introduced Palliative Medicine as specialized medical care for people living with serious illness. It focuses on providing relief from the symptoms and stress of a serious illness. The goal is to improve quality of life for both the patient and the family.  We discussed a brief life review of the patient. Patient has 2 children. Her daughter, [name] has been caring for her for almost 10 years d/t debility from prior stroke. [name] tells me how much the patient enjoyed spending time with her granddaughter.    We discussed her current illness and what it means in the larger context of her on-going co-morbidities.   Natural disease trajectory and expectations at EOL were discussed. Family understands she is nearing end of life and only want to focus on comfort moving forward.   Hospice and Palliative Care services outpatient were explained and offered. We discussed that patient would be eligible to go to hospice facility; discussed the type of care provided there. Necia is anxious about making decision to move to hospice home. She asks for time to think about it. We discussed that patient may become too unstable to move to hospice facility.   During my assessment patient was on 5 L nasal cannula - discussed with family that this was not adding to comfort and the use of medications for dyspnea. They agree to removal of oxygen and continue to state they do not want any measures that will prolong life.   Questions and concerns were addressed. The family was encouraged to call with questions or concerns.   Primary Decision Maker HCPOA - daughter, [name]     SUMMARY OF RECOMMENDATIONS   Full comfort care Medications being adjusted by primary - decreasing fentanyl drip - on my assessment patient appears comfortable Family considering hospice facility transfer Will follow up in AM with family regarding disposition and symptoms  Code Status/Advance Care Planning:  DNR   Symptom Management:   Patient currently remains on fentanyl drip and also has PRN morphine - medications adjusted by primary - she appears comfortable   Palliative Prophylaxis:   Aspiration, Bowel Regimen, Delirium Protocol, Frequent Pain Assessment and Oral Care  Additional Recommendations (Limitations, Scope, Preferences):  Full Comfort Care  Psycho-social/Spiritual:   Desire for further Chaplaincy support:no  Additional Recommendations: Education on Hospice  Prognosis:   Hours -  Days  Discharge Planning: Anticipated Hospital Death      Primary Diagnoses: Present on Admission: . Neutropenic sepsis ([place])   I  have reviewed the medical record, interviewed the patient and family, and examined the patient. The following aspects are pertinent.  Past Medical History:  Diagnosis Date  . Ankle fracture   . Dementia ([place])   . History of blood transfusion   . Hypertension   . Left-sided weakness    post stroke  . Stroke [facility]) 2009   "6 light" "2 Major"- "I take medication for memory"   Social History   Socioeconomic History  . Marital status: Single    Spouse name: Not on file  . Number of children: Not on file  . Years of education: Not on file  . Highest education level: Not on file  Occupational History  . Not on file  Social Needs  . Financial resource strain: Not on file  . Food insecurity:    Worry: Not on file    Inability: Not on file  . Transportation needs:    Medical: Not on file    Non-medical: Not on file  Tobacco Use  . Smoking status: Former Research scientist (life sciences)  . Smokeless tobacco: Never Used  . Tobacco comment: quit in 1990's  Substance and Sexual Activity  . Alcohol use: No  . Drug use: No  . Sexual activity: Not on file  Lifestyle  . Physical activity:    Days per week: Not on file    Minutes per session: Not on file  . Stress: Not on file  Relationships  . Social connections:    Talks on phone: Not on file    Gets together: Not on file    Attends religious service: Not on file    Active member of club or organization: Not on file    Attends meetings of clubs or organizations: Not on file    Relationship status: Not on file  Other Topics Concern  . Not on file  Social History Narrative  . Not on file   Family History  Adopted: Yes   Scheduled Meds: Continuous Infusions: . fentaNYL infusion INTRAVENOUS 25 mcg/hr (01/06/18 1443)   PRN Meds:.acetaminophen **OR** acetaminophen, diphenhydrAMINE, fentaNYL, glycopyrrolate **OR** glycopyrrolate **OR** glycopyrrolate, midazolam, morphine injection, ondansetron (ZOFRAN) IV, polyvinyl alcohol No Known Allergies Review  of Systems  Unable to perform ROS: Mental status change    Physical Exam  Constitutional: No distress.  HENT:  Head: Normocephalic and atraumatic.  Cardiovascular: Tachycardia present.  Pulmonary/Chest: Effort normal and breath sounds normal. No accessory muscle usage. No respiratory distress.  Abdominal: Soft.  Neurological: She is unresponsive.  Skin: Skin is warm and dry. She is not diaphoretic.    Vital Signs: BP 111/77 (BP Location: Left Arm)   Pulse (!) 118   Temp (!) 100.8 F (38.2 C) (Oral)   Resp 10   Ht '5\' 4"'$  (1.626 m)   Wt 66.5 kg   SpO2 100%   BMI 25.16 kg/m  Pain Scale: PAINAD   Pain Score: 2    SpO2: SpO2: 100 % O2 Device:SpO2: 100 % O2 Flow Rate: .O2 Flow Rate (L/min): 5 L/min  IO: Intake/output summary:   Intake/Output Summary (Last 24 hours) at 01/06/2018 1552 Last data filed at 01/06/2018 1330 Gross per 24 hour  Intake 27.03 ml  Output 350 ml  Net -322.97 ml    LBM: Last BM Date: 01/06/18 Baseline Weight: Weight: 64.5 kg Most recent weight: Weight: 66.5 kg  Palliative Assessment/Data: PPS 10%    Time Total: 70 minutes Greater than 50%  of this time was spent counseling and coordinating care related to the above assessment and plan.  Juel Burrow, DNP, AGNP-C Palliative Medicine Team [phone] Pager: [phone]

## 2018-01-07 LAB — CULTURE, BLOOD (ROUTINE X 2)
Culture: NO GROWTH
Special Requests: ADEQUATE

## 2018-01-07 NOTE — Progress Notes (Signed)
Nutrition Brief Note  Chart reviewed. Pt now transitioning to comfort care.  No further nutrition interventions warranted at this time.  Please re-consult as needed.   Ople Girgis A. Zakiyah Diop, RD, LDN, CDE Pager: [phone] After hours Pager: [phone]  

## 2018-01-07 NOTE — Plan of Care (Signed)
?  Problem: Clinical Measurements: ?Goal: Ability to maintain clinical measurements within normal limits will improve ?Outcome: Not Progressing ?  ?

## 2018-01-07 NOTE — Progress Notes (Signed)
Daily Progress Note   Patient Name: [name]       Date: 01/07/2018 DOB: May 21, 1955  Age: 62 y.o. MRN#: [id number] Attending Physician: Roxan Hockey, MD Primary Care Physician: Hayden Rasmussen, MD Admit Date: 01/23/2018  Reason for Consultation/Follow-up: Establishing goals of care, Hospice Evaluation, Non pain symptom management, Pain control, Psychosocial/spiritual support and Terminal Care  Subjective: Patient resting - appears comfortable - remains on low dose fentanyl drip - no prns needed per chart review.  No family at bedside.   Length of Stay: 5  Current Medications: Scheduled Meds:    Continuous Infusions: . fentaNYL infusion INTRAVENOUS 25 mcg/hr (01/06/18 1443)    PRN Meds: acetaminophen **OR** acetaminophen, diphenhydrAMINE, fentaNYL, glycopyrrolate **OR** glycopyrrolate **OR** glycopyrrolate, midazolam, morphine injection, ondansetron (ZOFRAN) IV, polyvinyl alcohol  Physical Exam         Constitutional: No distress.  HENT:  Head: Normocephalic and atraumatic.  Cardiovascular: Tachycardia present.  Pulmonary/Chest: Effort normal and breath sounds normal. No accessory muscle usage. No respiratory distress.  Abdominal: Soft.  Neurological: She is unresponsive.  Skin: Skin is warm and dry. She is not diaphoretic.    Vital Signs: BP 109/74 (BP Location: Left Arm)   Pulse (!) 133   Temp (!) 100.8 F (38.2 C) (Oral)   Resp 10   Ht 5\' 4"  (1.626 m)   Wt 66.5 kg   SpO2 94%   BMI 25.16 kg/m  SpO2: SpO2: 94 % O2 Device: O2 Device: Room Air O2 Flow Rate: O2 Flow Rate (L/min): 5 L/min  Intake/output summary:   Intake/Output Summary (Last 24 hours) at 01/07/2018 1023 Last data filed at 01/07/2018 5366 Gross per 24 hour  Intake 164.8 ml  Output 950 ml  Net -785.2 ml    LBM: Last BM Date: 01/06/18 Baseline Weight: Weight: 64.5 kg Most recent weight: Weight: 66.5 kg       Palliative Assessment/Data: PPS 10%    Flowsheet Rows     Most Recent Value  Intake Tab  Referral Department  Critical care  Unit at Time of Referral  ICU  Palliative Care Primary Diagnosis  Neurology  Date Notified  01/05/18  Palliative Care Type  New Palliative care  Reason for referral  End of Life Care Assistance  Date of Admission  01/08/2018  Date first seen by Palliative Care  01/06/18  # of days Palliative referral response time  1 Day(s)  # of days IP prior to Palliative referral  3  Clinical Assessment  Palliative Performance Scale Score  10%  Psychosocial & Spiritual Assessment  Palliative Care Outcomes  Patient/Family meeting held?  Yes  Who was at the meeting?  patient's daughter/HCPOA  Palliative Care Outcomes  Counseled regarding hospice, Provided end of life care assistance, Provided psychosocial or spiritual support      Patient Active Problem List   Diagnosis Date Noted  . Goals of care, counseling/discussion   . Palliative care by specialist   . Comfort measures only status   . Cerebral embolism with cerebral infarction 01/05/2018  . Acute respiratory failure with hypoxia ([place])   . Sepsis with acute renal failure ([place])   . SOB (shortness of breath)   . Chemotherapy-induced neutropenia (  [place])   . Non-STEMI (non-ST elevated myocardial infarction) ([place])   . LV dysfunction   . Neutropenic sepsis ([place]) 01/08/2018  . Displaced fracture of lateral malleolus of right fibula, subsequent encounter for closed fracture with routine healing     Palliative Care Assessment & Plan   HPI: 62 y.o. female  with past medical history of HTN, CVA w/ residual R side weakness, diffuse large B-cell lymphoma, systolic heart failure (EF 35-40%), and HOH admitted on 01/19/2018 after being found on the floor at home after an unwitnessed fall. Patient had just completed last  cycle of chemo 11/20. Diagnosed with sepsis.  During hospitalization patient had acute stroke. Discussions had between family and PCCM - family opted to focus on patient's comfort. PMT consulted to assist with comfort care and hospice evaluation.   Assessment: Follow up on patient today - appears comfortable. No symptom management needs. No family at bedside. Called daughter, [name] to provide update and discuss plan. She is still unsure if she wants her mother to be moved to hospice home. Questions addressed. Emotional support provided.   Recommendations/Plan:  Continue comfort care  Family considering hospice home but unable to make decision at this time - at this time patient appears stable to move to hospice home if family agrees to move  Goals of Care and Additional Recommendations:  Limitations on Scope of Treatment: Full Comfort Care  Code Status:  DNR  Prognosis:   Hours - Days  Discharge Planning:  Anticipated Hospital Death  Care plan was discussed with daughter, [name]  Thank you for allowing the Palliative Medicine Team to assist in the care of this patient.   Total Time 25 minutes Prolonged Time Billed  no       Greater than 50%  of this time was spent counseling and coordinating care related to the above assessment and plan.  Juel Burrow, DNP, [facility] Palliative Medicine Team Team Phone # [phone]  Pager [phone]

## 2018-01-07 NOTE — Progress Notes (Signed)
Patient Demographics:    [name], is a 62 y.o. female, DOB - 07-28-1955, MVH:[id number]  Admit date - 01/17/2018   Admitting Physician Rush Farmer, MD  Outpatient Primary MD for the patient is Hayden Rasmussen, MD  LOS - 5   Chief Complaint  Patient presents with  . Fall        Subjective:    [name] today has no fevers, no emesis,  No chest pain,  Appears comfortable, unresponsive, patient is actively dying  Assessment  & Plan :    Active Problems:   Neutropenic sepsis (HCC)   Chemotherapy-induced neutropenia (HCC)   Non-STEMI (non-ST elevated myocardial infarction) (HCC)   LV dysfunction   Acute respiratory failure with hypoxia (HCC)   Sepsis with acute renal failure (HCC)   SOB (shortness of breath)   Cerebral embolism with cerebral infarction   Goals of care, counseling/discussion   Palliative care by specialist   Comfort measures only status  Brief summary:- [name] is a 62 y.o. 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 No tPA given due to thrombocytopenia.    Patient is now comfort measures only  Plan:- 1)Neutropenic fever with Citrobacter Freundi sepsis----remains unresponsive, per family request she is now comfort measures only  2) suspected acute stroke possibly right cortical infarct----patient is hypercoagulable due to underlying malignancy, also had significant anemia with hemoglobin less than 4 , as well as persistent hypotension, cannot rule out CNS metastases, MRI not done due to hemodynamic and cardiopulmonary instability-family has decided to make patient comfort measures only at this time.  Neurology input appreciated  3) anemia and thrombocytopenia----no  further transfusions at this time patient is comfort measures only  Social/Ethics--- at the request of patient's daughter who is POA and patient's on a daughter-in-law fentanyl IV drip rate has been decreased to 2.5, patient remains comfortable but not waking up  ... patient is a DNR/DNI, anticipating Death within hours to days,  Disposition/Need for in-Hospital Stay- patient unable to be discharged at this time due to patient awaiting family's decision on possible transfer to residential hospice house   Code Status : DNR  Disposition Plan  : Residential hospice home  Consults  :  ID/oncology and neurology/PCCM   DVT Prophylaxis  :  hospice  Lab Results  Component Value Date   PLT 20 (LL) 01/05/2018    Inpatient Medications  Scheduled Meds: Continuous Infusions: . fentaNYL infusion INTRAVENOUS 50 mcg/hr (01/07/18 1630)   PRN Meds:.acetaminophen **OR** acetaminophen, diphenhydrAMINE, fentaNYL, glycopyrrolate **OR** glycopyrrolate **OR** glycopyrrolate, midazolam, morphine injection, ondansetron (ZOFRAN) IV, polyvinyl alcohol    Anti-infectives (From admission, onward)   Start     Dose/Rate Route Frequency Ordered Stop   01/05/18 1600  acyclovir (ZOVIRAX) 200 MG/5ML suspension SUSP 400 mg  Status:  Discontinued     400 mg Oral 3 times daily 01/04/18 1010 01/04/18 1022   01/04/18 1100  acyclovir (ZOVIRAX) 400 mg in dextrose 5 % 100 mL IVPB  Status:  Discontinued     400 mg 108 mL/hr over 60 Minutes Intravenous  Every 8 hours 01/04/18 1009 01/05/18 0842   01/04/18 1100  ceFEPIme (MAXIPIME) 2 g in sodium chloride 0.9 % 100 mL IVPB  Status:  Discontinued     2 g 200 mL/hr over 30 Minutes Intravenous Every 12 hours 01/04/18 1052 01/05/18 0842   01/03/18 1600  vancomycin (VANCOCIN) IVPB 750 mg/150 ml premix  Status:  Discontinued     750 mg 150 mL/hr over 60 Minutes Intravenous Every 24 hours 01/06/2018 1512 01/04/18 0946   01/03/18 1600  ceFEPIme (MAXIPIME) 2 g in sodium chloride 0.9  % 100 mL IVPB  Status:  Discontinued     2 g 200 mL/hr over 30 Minutes Intravenous Every 24 hours 01/03/18 1017 01/04/18 1052   01/03/18 1130  acyclovir (ZOVIRAX) 200 MG/5ML suspension SUSP 400 mg  Status:  Discontinued     400 mg Oral 3 times daily 01/03/18 1124 01/04/18 1010   01/03/18 0500  meropenem (MERREM) 1 g in sodium chloride 0.9 % 100 mL IVPB  Status:  Discontinued     1 g 200 mL/hr over 30 Minutes Intravenous Every 12 hours 01/04/2018 1628 01/03/18 1017   01/11/2018 2100  ceFEPIme (MAXIPIME) 1 g in sodium chloride 0.9 % 100 mL IVPB  Status:  Discontinued     1 g 200 mL/hr over 30 Minutes Intravenous Every 8 hours 01/19/2018 1512 01/27/2018 1533   01/17/2018 2000  fluconazole (DIFLUCAN) IVPB 400 mg  Status:  Discontinued     400 mg 100 mL/hr over 120 Minutes Intravenous Every 24 hours 01/18/2018 1809 01/03/18 1017   01/26/2018 1700  meropenem (MERREM) 2 g in sodium chloride 0.9 % 100 mL IVPB     2 g 200 mL/hr over 30 Minutes Intravenous  Once 01/29/2018 1620 01/29/2018 2226   01/30/2018 1230  ceFEPIme (MAXIPIME) 2 g in sodium chloride 0.9 % 100 mL IVPB     2 g 200 mL/hr over 30 Minutes Intravenous  Once 01/08/2018 1224 01/13/2018 1337   01/10/2018 1230  metroNIDAZOLE (FLAGYL) IVPB 500 mg  Status:  Discontinued     500 mg 100 mL/hr over 60 Minutes Intravenous Every 8 hours 01/13/2018 1224 01/08/2018 1533   01/19/2018 1230  vancomycin (VANCOCIN) IVPB 1000 mg/200 mL premix     1,000 mg 200 mL/hr over 60 Minutes Intravenous  Once 01/19/2018 1224 01/23/2018 1845        Objective:   Vitals:   01/05/18 1900 01/05/18 2000 01/06/18 0559 01/07/18 0550  BP: 100/76  111/77 109/74  Pulse: (!) 102 (!) 111 (!) 118 (!) 133  Resp: (!) 8 10    Temp:   (!) 100.8 F (38.2 C) (!) 100.8 F (38.2 C)  TempSrc:   Oral Oral  SpO2: 99% 100% 100% 94%  Weight:      Height:        Wt Readings from Last 3 Encounters:  01/05/18 66.5 kg  08/26/16 67.2 kg  04/07/16 70.3 kg     Intake/Output Summary (Last 24 hours) at  01/07/2018 1643 Last data filed at 01/07/2018 1330 Gross per 24 hour  Intake 164.8 ml  Output 950 ml  Net -785.2 ml     Physical Exam Patient is examined daily including today on 01/07/18 , exams remain the same as of yesterday except that has changed   Gen:-Unresponsive, appears comfortable on fentanyl drip  HEENT:- [place].AT, conjunctiva edema Lungs-diminished bilaterally, no wheezing CV- S1, S2 normal, regular  Abd-  +ve B.Sounds, Abd Soft, No groans/Moans on palpation tenderness,  Extremity/Skin:- No  edema, pedal pulses present  Neuro/Psych-unresponsive,  Data Review:   Micro Results Recent Results (from the past 240 hour(s))  Blood culture (routine x 2)     Status: Abnormal   Collection Time: 01/26/2018 12:48 PM  Result Value Ref Range Status   Specimen Description BLOOD PORTA CATH  Final   Special Requests   Final    BOTTLES DRAWN AEROBIC AND ANAEROBIC Blood Culture adequate volume   Culture  Setup Time   Final    GRAM NEGATIVE RODS ANAEROBIC BOTTLE ONLY CRITICAL RESULT CALLED TO, READ BACK BY AND VERIFIED WITH: Hughie Closs PHARMD, AT 0802 01/04/18 BY Rush Landmark Performed at [facility], [address], [place], [place] [address]    Culture CITROBACTER FREUNDII (A)  Final   Report Status 01/05/2018 FINAL  Final   Organism ID, Bacteria CITROBACTER FREUNDII  Final      Susceptibility   Citrobacter freundii - MIC*    CEFAZOLIN >=64 RESISTANT Resistant     CEFEPIME <=1 SENSITIVE Sensitive     CEFTAZIDIME <=1 SENSITIVE Sensitive     CEFTRIAXONE <=1 SENSITIVE Sensitive     CIPROFLOXACIN <=0.25 SENSITIVE Sensitive     GENTAMICIN <=1 SENSITIVE Sensitive     IMIPENEM 4 SENSITIVE Sensitive     TRIMETH/SULFA <=20 SENSITIVE Sensitive     PIP/TAZO <=4 SENSITIVE Sensitive     * CITROBACTER FREUNDII  Blood culture (routine x 2)     Status: None   Collection Time: 01/31/2018  2:32 PM  Result Value Ref Range Status   Specimen Description BLOOD RIGHT ANTECUBITAL  Final   Special  Requests   Final    BOTTLES DRAWN AEROBIC ONLY Blood Culture adequate volume   Culture   Final    NO GROWTH 5 DAYS Performed at [facility], [address], [place], [place] [address]    Report Status 01/07/2018 FINAL  Final  Urine culture     Status: None   Collection Time: 01/15/2018  3:32 PM  Result Value Ref Range Status   Specimen Description URINE, CATHETERIZED  Final   Special Requests NONE  Final   Culture   Final    NO GROWTH Performed at [facility], [place] [address], [place], [place] [address]    Report Status 01/03/2018 FINAL  Final  MRSA PCR Screening     Status: Abnormal   Collection Time: 01/31/2018  7:27 PM  Result Value Ref Range Status   MRSA by PCR POSITIVE (A) NEGATIVE Final    Comment:        The GeneXpert MRSA Assay (FDA approved for NASAL specimens only), is one component of a comprehensive MRSA colonization surveillance program. It is not intended to diagnose MRSA infection nor to guide or monitor treatment for MRSA infections. RESULT CALLED TO, READ BACK BY AND VERIFIED WITH: [place] 2344 01/08/2018 MITCHELL,L Performed at [facility], [place] [address], [place], [place] [address]   Gastrointestinal Panel by PCR , Stool     Status: None   Collection Time: 01/04/18  9:48 AM  Result Value Ref Range Status   Campylobacter species NOT DETECTED NOT DETECTED Final   Plesimonas shigelloides NOT DETECTED NOT DETECTED Final   Salmonella species NOT DETECTED NOT DETECTED Final   Yersinia enterocolitica NOT DETECTED NOT DETECTED Final   Vibrio species NOT DETECTED NOT DETECTED Final   Vibrio cholerae NOT DETECTED NOT DETECTED Final   Enteroaggregative E coli (EAEC) NOT DETECTED  NOT DETECTED Final   Enteropathogenic E coli (EPEC) NOT DETECTED NOT DETECTED Final   Enterotoxigenic E coli (ETEC) NOT DETECTED NOT DETECTED Final   Shiga like toxin producing E coli (STEC) NOT DETECTED NOT DETECTED Final   Shigella/Enteroinvasive E coli (EIEC) NOT  DETECTED NOT DETECTED Final   Cryptosporidium NOT DETECTED NOT DETECTED Final   Cyclospora cayetanensis NOT DETECTED NOT DETECTED Final   Entamoeba histolytica NOT DETECTED NOT DETECTED Final   Giardia lamblia NOT DETECTED NOT DETECTED Final   Adenovirus F40/41 NOT DETECTED NOT DETECTED Final   Astrovirus NOT DETECTED NOT DETECTED Final   Norovirus GI/GII NOT DETECTED NOT DETECTED Final   Rotavirus A NOT DETECTED NOT DETECTED Final   Sapovirus (I, II, IV, and V) NOT DETECTED NOT DETECTED Final    Comment: Performed at [facility], [address], [place], [place] [address]    Radiology Reports Ct Abdomen Pelvis Wo Contrast  Result Date: 01/07/2018 CLINICAL DATA:  Left-sided weakness. Recent chemotherapy for lymphoma presents today post fall. Found this morning on ground. EXAM: CT CHEST, ABDOMEN AND PELVIS WITHOUT CONTRAST TECHNIQUE: Multidetector CT imaging of the chest, abdomen and pelvis was performed following the standard protocol without IV contrast. COMPARISON:  MRI pelvis 12/27/2017 FINDINGS: CT CHEST FINDINGS Cardiovascular: Right IJ Port-A-Cath has tip in the SVC. Heart is normal size. Mild calcified plaque over the thoracic aorta. Mild calcified plaque over the left main and 3 vessel coronary arteries. Remaining vascular structures are unremarkable. Mediastinum/Nodes: No evidence of mediastinal or hilar adenopathy. Remaining mediastinal structures are unremarkable. Lungs/Pleura: Lungs are adequately inflated without focal airspace consolidation or effusion. There is minimal bibasilar dependent atelectasis. Airways are normal. Musculoskeletal: Degenerative change of the spine. CT ABDOMEN PELVIS FINDINGS Hepatobiliary: Liver and biliary tree are normal. Gallbladder is within normal. Pancreas: Normal. Spleen: Normal. Adrenals/Urinary Tract: Adrenal glands are normal. Kidneys normal size without hydronephrosis or nephrolithiasis. Couple small calcifications over the renal hilum  likely vascular. 2.2 cm cyst over the mid pole right kidney. Ureters and bladder are normal. Stomach/Bowel: Stomach and small bowel are normal. Appendix is normal. Colon is normal. Vascular/Lymphatic: Mild-to-moderate calcified plaque over the abdominal aorta. No adenopathy. Reproductive: Uterus and left ovary are normal. Solid right adnexal mass measuring 4.1 x 6.5 cm unchanged from recent MRI. Other: No free fluid or focal inflammatory change. Musculoskeletal: Degenerative change of the spine. No lytic/sclerotic lesions. IMPRESSION: No acute findings in the chest, abdomen or pelvis. Known solid right adnexal mass measuring 4.1 x 6.5 cm unchanged from recent MRI and likely neoplastic in nature. No adenopathy. Continue to recommend surgical consultation. 2.2 cm right renal cyst. Aortic Atherosclerosis (ICD10-I70.0). Atherosclerotic coronary artery disease. Electronically Signed   By: Marin Olp M.D.   On: 01/15/2018 16:55   Dg Chest 1 View  Result Date: 01/03/2018 CLINICAL DATA:  62 year old female with shortness of breath. EXAM: CHEST  1 VIEW COMPARISON:  Chest radiograph dated 01/22/2018 FINDINGS: Right pectoral infusion catheter with tip over central SVC in similar position. Slight interstitial prominence may represent mild edema. Bilateral perihilar and left infrahilar streaky densities may represent atelectasis although infiltrate is not excluded. There is no pleural effusion or pneumothorax. There is mild cardiomegaly. Atherosclerotic calcification of the aortic arch. No acute osseous pathology. Degenerative changes of the spine. IMPRESSION: 1. Mild cardiomegaly with possible mild edema. 2. Bilateral perihilar and left infrahilar atelectasis versus infiltrate. Electronically Signed   By: Anner Crete M.D.   On: 01/03/2018 21:21   Dg Pelvis 1-2 Views  Result Date: 01/30/2018 CLINICAL DATA:  Fall EXAM: PELVIS - 1-2 VIEW COMPARISON:  None. FINDINGS: There is no evidence of pelvic fracture or  diastasis. No pelvic bone lesions are seen. IMPRESSION: Negative. Electronically Signed   By: Franchot Gallo M.D.   On: 01/28/2018 13:01   Ct Head Wo Contrast  Result Date: 01/13/2018 CLINICAL DATA:  Left-sided weakness with recent fall history of lymphoma EXAM: CT HEAD WITHOUT CONTRAST CT CERVICAL SPINE WITHOUT CONTRAST TECHNIQUE: Multidetector CT imaging of the head and cervical spine was performed following the standard protocol without intravenous contrast. Multiplanar CT image reconstructions of the cervical spine were also generated. COMPARISON:  MRI 03/14/2007 FINDINGS: CT HEAD FINDINGS Brain: No acute territorial infarction, hemorrhage or intracranial mass. Mild atrophy. Mild small vessel ischemic changes of the white matter. Chronic lacunar infarct in the left basal ganglia. Stable ventricle size. Vascular: No hyperdense vessels.  Carotid vascular calcification Skull: Normal. Negative for fracture or focal lesion. Sinuses/Orbits: No acute orbital abnormality. Moderate mucosal opacification of the ethmoid sinuses. Complete opacification of right maxillary sinus with small air bubbles. Mucosal thickening left maxillary sinus. Extension of opacifications into the right nasal passage. Other: None CT CERVICAL SPINE FINDINGS Alignment: Straightening of the cervical spine. No subluxation. Facet alignment within normal limits. Skull base and vertebrae: No acute fracture. No primary bone lesion or focal pathologic process. Soft tissues and spinal canal: No prevertebral fluid or swelling. No visible canal hematoma. Disc levels:  Mild degenerative changes at C4-C5 and C5-C6. Upper chest: Negative. Other: None IMPRESSION: 1. No CT evidence for acute intracranial abnormality. Atrophy and small vessel ischemic changes of the white matter. Chronic lacunar infarct in the left basal ganglia 2. No acute osseous abnormality of the cervical spine 3. Paranasal sinus disease Electronically Signed   By: Donavan Foil M.D.    On: 01/29/2018 16:52   Ct Chest Wo Contrast  Result Date: 01/29/2018 CLINICAL DATA:  Left-sided weakness. Recent chemotherapy for lymphoma presents today post fall. Found this morning on ground. EXAM: CT CHEST, ABDOMEN AND PELVIS WITHOUT CONTRAST TECHNIQUE: Multidetector CT imaging of the chest, abdomen and pelvis was performed following the standard protocol without IV contrast. COMPARISON:  MRI pelvis 12/27/2017 FINDINGS: CT CHEST FINDINGS Cardiovascular: Right IJ Port-A-Cath has tip in the SVC. Heart is normal size. Mild calcified plaque over the thoracic aorta. Mild calcified plaque over the left main and 3 vessel coronary arteries. Remaining vascular structures are unremarkable. Mediastinum/Nodes: No evidence of mediastinal or hilar adenopathy. Remaining mediastinal structures are unremarkable. Lungs/Pleura: Lungs are adequately inflated without focal airspace consolidation or effusion. There is minimal bibasilar dependent atelectasis. Airways are normal. Musculoskeletal: Degenerative change of the spine. CT ABDOMEN PELVIS FINDINGS Hepatobiliary: Liver and biliary tree are normal. Gallbladder is within normal. Pancreas: Normal. Spleen: Normal. Adrenals/Urinary Tract: Adrenal glands are normal. Kidneys normal size without hydronephrosis or nephrolithiasis. Couple small calcifications over the renal hilum likely vascular. 2.2 cm cyst over the mid pole right kidney. Ureters and bladder are normal. Stomach/Bowel: Stomach and small bowel are normal. Appendix is normal. Colon is normal. Vascular/Lymphatic: Mild-to-moderate calcified plaque over the abdominal aorta. No adenopathy. Reproductive: Uterus and left ovary are normal. Solid right adnexal mass measuring 4.1 x 6.5 cm unchanged from recent MRI. Other: No free fluid or focal inflammatory change. Musculoskeletal: Degenerative change of the spine. No lytic/sclerotic lesions. IMPRESSION: No acute findings in the chest, abdomen or pelvis. Known solid right  adnexal mass measuring 4.1 x 6.5 cm unchanged from recent MRI and likely neoplastic  in nature. No adenopathy. Continue to recommend surgical consultation. 2.2 cm right renal cyst. Aortic Atherosclerosis (ICD10-I70.0). Atherosclerotic coronary artery disease. Electronically Signed   By: Marin Olp M.D.   On: 01/10/2018 16:55   Ct Cervical Spine Wo Contrast  Result Date: 01/20/2018 CLINICAL DATA:  Left-sided weakness with recent fall history of lymphoma EXAM: CT HEAD WITHOUT CONTRAST CT CERVICAL SPINE WITHOUT CONTRAST TECHNIQUE: Multidetector CT imaging of the head and cervical spine was performed following the standard protocol without intravenous contrast. Multiplanar CT image reconstructions of the cervical spine were also generated. COMPARISON:  MRI 03/14/2007 FINDINGS: CT HEAD FINDINGS Brain: No acute territorial infarction, hemorrhage or intracranial mass. Mild atrophy. Mild small vessel ischemic changes of the white matter. Chronic lacunar infarct in the left basal ganglia. Stable ventricle size. Vascular: No hyperdense vessels.  Carotid vascular calcification Skull: Normal. Negative for fracture or focal lesion. Sinuses/Orbits: No acute orbital abnormality. Moderate mucosal opacification of the ethmoid sinuses. Complete opacification of right maxillary sinus with small air bubbles. Mucosal thickening left maxillary sinus. Extension of opacifications into the right nasal passage. Other: None CT CERVICAL SPINE FINDINGS Alignment: Straightening of the cervical spine. No subluxation. Facet alignment within normal limits. Skull base and vertebrae: No acute fracture. No primary bone lesion or focal pathologic process. Soft tissues and spinal canal: No prevertebral fluid or swelling. No visible canal hematoma. Disc levels:  Mild degenerative changes at C4-C5 and C5-C6. Upper chest: Negative. Other: None IMPRESSION: 1. No CT evidence for acute intracranial abnormality. Atrophy and small vessel ischemic changes  of the white matter. Chronic lacunar infarct in the left basal ganglia 2. No acute osseous abnormality of the cervical spine 3. Paranasal sinus disease Electronically Signed   By: Donavan Foil M.D.   On: 01/11/2018 16:52   Dg Chest Port 1 View  Result Date: 01/30/2018 CLINICAL DATA:  Fall.  History of lymphoma EXAM: PORTABLE CHEST 1 VIEW COMPARISON:  Chest two-view 03/15/2007 FINDINGS: Heart size mildly enlarged. Negative for heart failure. Atherosclerotic aortic arch. Lungs are clear without infiltrate or effusion. Port-A-Cath tip in the SVC. IMPRESSION: No active disease. Electronically Signed   By: Franchot Gallo M.D.   On: 01/14/2018 13:00   Dg Knee Left Port  Result Date: 01/30/2018 CLINICAL DATA:  Fall EXAM: PORTABLE LEFT KNEE - 1-2 VIEW COMPARISON:  None. FINDINGS: No evidence of fracture, dislocation, or joint effusion. No evidence of arthropathy or other focal bone abnormality. Soft tissues are unremarkable. IMPRESSION: Negative. Electronically Signed   By: Franchot Gallo M.D.   On: 01/23/2018 13:01   Dg Knee Right Port  Result Date: 01/30/2018 CLINICAL DATA:  Fall EXAM: PORTABLE RIGHT KNEE - 1-2 VIEW COMPARISON:  None. FINDINGS: No evidence of fracture, dislocation, or joint effusion. No evidence of arthropathy or other focal bone abnormality. Soft tissues are unremarkable. IMPRESSION: Negative. Electronically Signed   By: Franchot Gallo M.D.   On: 01/16/2018 13:06   Ct Head Code Stroke Wo Contrast  Result Date: 01/03/2018 CLINICAL DATA:  Code stroke.  62 y/o  F; left facial droop. EXAM: CT HEAD WITHOUT CONTRAST TECHNIQUE: Contiguous axial images were obtained from the base of the skull through the vertex without intravenous contrast. COMPARISON:  01/29/2018 CT head. FINDINGS: Brain: No evidence of acute infarction, hemorrhage, hydrocephalus, extra-axial collection or mass lesion/mass effect. Small chronic infarcts are present within the bilateral caudate bodies and the left corona  radiata. Few nonspecific white matter hypodensities compatible with chronic microvascular ischemic changes are stable. Stable mild volume  loss of the brain. Vascular: Calcific atherosclerosis of carotid siphons and vertebral arteries. No hyperdense vessel identified. Skull: Normal. Negative for fracture or focal lesion. Sinuses/Orbits: Extensive paranasal sinus disease with complete opacification of right maxillary sinus. Normal aeration of the mastoid air cells. Bilateral intra-ocular lens replacement. Other: None. ASPECTS [facility] Stroke Program Early CT Score) - Ganglionic level infarction (caudate, lentiform nuclei, internal capsule, insula, M1-M3 cortex): 7 - Supraganglionic infarction (M4-M6 cortex): 3 Total score (0-10 with 10 being normal): 10 IMPRESSION: 1. No acute intracranial abnormality identified. Stable CT of the head. 2. ASPECTS is 10 3. Stable chronic microvascular ischemic changes and volume loss of the brain. Stable bilateral chronic lacunar infarcts in the basal ganglia. These results were communicated to Dr. Lorraine Lax at [facility] 12/2/2019by text page via the [facility] messaging system. Electronically Signed   By: Kristine Garbe M.D.   On: 01/03/2018 19:17   Vas US Carotid  Result Date: 01/05/2018 Carotid Arterial Duplex Study Indications: CVA. Performing Technologist: Maudry Mayhew MHA, RDMS, RVT, RDCS  Examination Guidelines: A complete evaluation includes B-mode imaging, spectral Doppler, color Doppler, and power Doppler as needed of all accessible portions of each vessel. Bilateral testing is considered an integral part of a complete examination. Limited examinations for reoccurring indications may be performed as noted.  Right Carotid Findings: +----------+-------+-------+--------+------------------------+-----------------+           PSV    EDV    StenosisDescribe                Comments                    cm/s   cm/s                                                      +----------+-------+-------+--------+------------------------+-----------------+ CCA Prox  59     11                                                       +----------+-------+-------+--------+------------------------+-----------------+ CCA Distal46     14                                     intimal                                                                   thickening        +----------+-------+-------+--------+------------------------+-----------------+ ICA Prox  64     29             heterogenous and  irregular                                 +----------+-------+-------+--------+------------------------+-----------------+ ICA Distal60     24             smooth and heterogenous                   +----------+-------+-------+--------+------------------------+-----------------+ ECA       97     13             smooth and heterogenous                   +----------+-------+-------+--------+------------------------+-----------------+ +----------+--------+-------+----------------+-------------------+           PSV cm/sEDV cmsDescribe        Arm Pressure (mmHG) +----------+--------+-------+----------------+-------------------+ Subclavian               Multiphasic, WNL                    +----------+--------+-------+----------------+-------------------+ +---------+--------+--+--------+--+---------+ VertebralPSV cm/s45EDV cm/s13Antegrade +---------+--------+--+--------+--+---------+  Left Carotid Findings: +----------+-------+-------+--------+---------------------------------+--------+           PSV    EDV    StenosisDescribe                         Comments           cm/s   cm/s                                                     +----------+-------+-------+--------+---------------------------------+--------+ CCA Prox  91     19             homogeneous and smooth                     +----------+-------+-------+--------+---------------------------------+--------+ CCA Distal36     17             smooth and heterogenous                   +----------+-------+-------+--------+---------------------------------+--------+ ICA Prox  78     32             irregular, heterogenous and                                               calcific                                  +----------+-------+-------+--------+---------------------------------+--------+ ICA Distal73     28                                                       +----------+-------+-------+--------+---------------------------------+--------+ ECA       70     9                                                        +----------+-------+-------+--------+---------------------------------+--------+ +----------+--------+--------+----------------+-------------------+  SubclavianPSV cm/sEDV cm/sDescribe        Arm Pressure (mmHG) +----------+--------+--------+----------------+-------------------+           83              Multiphasic, WNL                    +----------+--------+--------+----------------+-------------------+ +---------+--------+--+--------+--+---------+ VertebralPSV cm/s44EDV cm/s15Antegrade +---------+--------+--+--------+--+---------+  Summary: Right Carotid: Velocities in the right ICA are consistent with a 1-39% stenosis. Left Carotid: Velocities in the left ICA are consistent with a 1-39% stenosis. Vertebrals:  Bilateral vertebral arteries demonstrate antegrade flow. Subclavians: Normal flow hemodynamics were seen in bilateral subclavian              arteries. *See table(s) above for measurements and observations.  Electronically signed by Antony Contras MD on 01/05/2018 at 8:42:00 AM.    Final    Vas [place] Lower Extremity Venous (dvt)  Result Date: 01/05/2018  Lower Venous Study Indications: Stroke.  Performing Technologist: Maudry Mayhew MHA, RDMS, RVT, RDCS   Examination Guidelines: A complete evaluation includes B-mode imaging, spectral Doppler, color Doppler, and power Doppler as needed of all accessible portions of each vessel. Bilateral testing is considered an integral part of a complete examination. Limited examinations for reoccurring indications may be performed as noted.  Right Venous Findings: +---------+---------------+---------+-----------+----------+-------------------+          CompressibilityPhasicitySpontaneityPropertiesSummary             +---------+---------------+---------+-----------+----------+-------------------+ CFV      Full           Yes      Yes                                      +---------+---------------+---------+-----------+----------+-------------------+ SFJ      Full                                                             +---------+---------------+---------+-----------+----------+-------------------+ FV Prox  Full                                                             +---------+---------------+---------+-----------+----------+-------------------+ FV Mid   Full                                                             +---------+---------------+---------+-----------+----------+-------------------+ FV DistalFull                                                             +---------+---------------+---------+-----------+----------+-------------------+ PFV      Full                                                             +---------+---------------+---------+-----------+----------+-------------------+  POP      Full           Yes      Yes                                      +---------+---------------+---------+-----------+----------+-------------------+ PTV      Full                                                             +---------+---------------+---------+-----------+----------+-------------------+ PERO     None                                          Acute               +---------+---------------+---------+-----------+----------+-------------------+ GSV      None                                         At saphenofemoral                                                         junction            +---------+---------------+---------+-----------+----------+-------------------+  Left Venous Findings: +---------+---------------+---------+-----------+----------+-------------------+          CompressibilityPhasicitySpontaneityPropertiesSummary             +---------+---------------+---------+-----------+----------+-------------------+ CFV      Full           Yes      Yes                                      +---------+---------------+---------+-----------+----------+-------------------+ SFJ      Full                                                             +---------+---------------+---------+-----------+----------+-------------------+ FV Prox  Full                                                             +---------+---------------+---------+-----------+----------+-------------------+ FV Mid   Full                                                             +---------+---------------+---------+-----------+----------+-------------------+  FV DistalFull                                                             +---------+---------------+---------+-----------+----------+-------------------+ PFV      Full                                                             +---------+---------------+---------+-----------+----------+-------------------+ POP      Full           Yes      Yes                                      +---------+---------------+---------+-----------+----------+-------------------+ PTV      Full                                                             +---------+---------------+---------+-----------+----------+-------------------+ PERO     Full                                                              +---------+---------------+---------+-----------+----------+-------------------+ GSV      None                                         0.6cm from                                                                saphenofemoral                                                            junction            +---------+---------------+---------+-----------+----------+-------------------+    Summary: Right: Findings consistent with acute deep vein thrombosis involving the right peroneal vein. Findings consistent with acute superficial vein thrombosis involving the right great saphenous vein at the saphenofemoral junction. No cystic structure found in  the popliteal fossa. Left: Findings consistent with acute superficial vein thrombosis involving the left great saphenous vein 0.6cm away from the saphenofemoral junction. There is no evidence of deep vein thrombosis in the lower extremity. No cystic structure found in the popliteal fossa.  *See table(s)  above for measurements and observations. Electronically signed by Quay Burow MD on 01/05/2018 at 4:58:32 PM.    Final      CBC Recent Labs  Lab 01/24/2018 1247 01/13/2018 2049 01/03/18 8099 01/03/18 8338 01/03/18 1652 01/04/18 0213 01/04/18 0517 01/05/18 0121  WBC 0.4* 0.3* 0.6*  --   --   --  3.1* 7.6  HGB 3.3* 8.8* 8.7* 8.7* 9.4* 9.7* 9.5* 10.1*  HCT 11.2* 27.2* 26.5* 27.9* 29.1* 28.9* 29.4* 31.9*  PLT 32* 27* 25*  --   --   --  23* 20*  MCV 97.4 90.4 89.2  --   --   --  89.1 92.7  MCH 28.7 29.2 29.3  --   --   --  28.8 29.4  MCHC 29.5* 32.4 32.8  --   --   --  32.3 31.7  RDW 17.3* 15.6* 17.2*  --   --   --  17.5* 17.9*  LYMPHSABS  --   --   --   --   --   --  0.1* 0.4*  MONOABS  --   --   --   --   --   --  0.4 0.8  EOSABS  --   --   --   --   --   --  0.0 0.0  BASOSABS  --   --   --   --   --   --  0.0 0.1    Chemistries  Recent Labs  Lab 01/06/2018 1247 01/21/2018 1748 01/03/18 0650  01/04/18 0517 01/05/18 0114 01/05/18 0121  NA 130* 132* 137 139  --  141  K 3.1* 3.0* 2.9* 3.0*  --  4.2  CL 98 103 107 112*  --  117*  CO2 14* 21* 20* 16*  --  14*  GLUCOSE 212* 169* 117* 146*  --  230*  BUN 43* 43* 42* 43*  --  50*  CREATININE 2.48* 2.02* 1.84* 1.62*  --  1.47*  CALCIUM 8.2* 7.4* 7.9* 8.2*  --  8.3*  MG  --  2.0 2.0 2.0 1.8  --   AST 63*  --   --   --   --  67*  ALT 33  --   --   --   --  31  ALKPHOS 44  --   --   --   --  48  BILITOT 1.8*  --   --   --   --  2.0*   ------------------------------------------------------------------------------------------------------------------ No results for input(s): CHOL, HDL, LDLCALC, TRIG, CHOLHDL, LDLDIRECT in the last 72 hours.  Lab Results  Component Value Date   HGBA1C 6.0 (H) 01/04/2018   ---------------------------------------------------------------------------------- Coagulation profile Recent Labs  Lab 01/26/2018 1748  INR 1.46    No results for input(s): DDIMER in the last 72 hours.  Cardiac Enzymes Recent Labs  Lab 01/03/18 0822 01/03/18 1503 01/03/18 2059  TROPONINI 6.72* 4.81* 2.51*   ------------------------------------------------------------------------------------------------------------------ No results found for: BNP   Roxan Hockey M.D on 01/07/2018 at 4:43 PM  Pager---[phone] Go to [email or web address] - password [email or web address] for contact info  [facility] - Office  [phone]

## 2018-01-08 NOTE — Plan of Care (Signed)
?  Problem: Clinical Measurements: ?Goal: Ability to maintain clinical measurements within normal limits will improve ?Outcome: Not Progressing ?  ?

## 2018-01-08 NOTE — Progress Notes (Signed)
Patient Demographics:    [name], is a 62 y.o. female, DOB - [birth year], VWU:[id number]  Admit date - 01/13/2018   Admitting Physician Rush Farmer, MD  Outpatient Primary MD for the patient is Hayden Rasmussen, MD  LOS - 6   Chief Complaint  Patient presents with  . Fall        Subjective:    [name] today has no fevers, no emesis,  No chest pain,  Appears comfortable, unresponsive, patient is actively dying, son at bedside, questions answered  Assessment  & Plan :    Active Problems:   Neutropenic sepsis (HCC)   Chemotherapy-induced neutropenia (HCC)   Non-STEMI (non-ST elevated myocardial infarction) (HCC)   LV dysfunction   Acute respiratory failure with hypoxia (HCC)   Sepsis with acute renal failure (HCC)   SOB (shortness of breath)   Cerebral embolism with cerebral infarction   Goals of care, counseling/discussion   Palliative care by specialist   Comfort measures only status  Brief Summary:- [name] is a 62 y.o. 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 No tPA given due to thrombocytopenia.    Patient is now comfort measures only  Plan:- 1)Neutropenic fever with Citrobacter Freundi sepsis----remains unresponsive, per family request she remains comfort measures only  2) suspected acute stroke possibly right cortical infarct----patient is hypercoagulable due to underlying malignancy, also had significant anemia with hemoglobin less than 4 , as well as persistent hypotension, cannot rule out CNS metastases, MRI not done due to hemodynamic and cardiopulmonary instability-family has decided to make patient comfort measures only at this time.  Neurology input  appreciated  3) anemia and thrombocytopenia----no further transfusions at this time patient is comfort measures only  Social/Ethics--- DNR/DNI, continue IV fentanyl drip patient remains comfortable , anticipating Death within hours to days,  Disposition/Need for in-Hospital Stay- patient unable to be discharged at this time due to awaiting bed availability for possible transfer to residential hospice house   Code Status : DNR  Disposition Plan  : Residential hospice home  Consults  :  ID/oncology and neurology/PCCM   DVT Prophylaxis  :  hospice  Lab Results  Component Value Date   PLT 20 (LL) 01/05/2018    Inpatient Medications  Scheduled Meds: Continuous Infusions: . fentaNYL infusion INTRAVENOUS 50 mcg/hr (01/07/18 1741)   PRN Meds:.acetaminophen **OR** acetaminophen, diphenhydrAMINE, fentaNYL, glycopyrrolate **OR** glycopyrrolate **OR** glycopyrrolate, midazolam, morphine injection, ondansetron (ZOFRAN) IV, polyvinyl alcohol    Anti-infectives (From admission, onward)   Start     Dose/Rate Route Frequency Ordered Stop   01/05/18 1600  acyclovir (ZOVIRAX) 200 MG/5ML suspension SUSP 400 mg  Status:  Discontinued     400 mg Oral 3 times daily 01/04/18 1010 01/04/18 1022   01/04/18 1100  acyclovir (ZOVIRAX) 400 mg in dextrose 5 % 100 mL IVPB  Status:  Discontinued     400 mg 108 mL/hr over 60 Minutes Intravenous Every 8 hours 01/04/18 1009 01/05/18 0842   01/04/18 1100  ceFEPIme (MAXIPIME) 2 g in sodium chloride 0.9 % 100 mL IVPB  Status:  Discontinued     2 g 200 mL/hr over 30 Minutes Intravenous Every 12 hours 01/04/18 1052 01/05/18 0842   01/03/18 1600  vancomycin (VANCOCIN) IVPB 750 mg/150 ml premix  Status:  Discontinued     750 mg 150 mL/hr over 60 Minutes Intravenous Every 24 hours 01/19/2018 1512 01/04/18 0946   01/03/18 1600  ceFEPIme (MAXIPIME) 2 g in sodium chloride 0.9 % 100 mL IVPB  Status:  Discontinued     2 g 200 mL/hr over 30 Minutes Intravenous Every 24  hours 01/03/18 1017 01/04/18 1052   01/03/18 1130  acyclovir (ZOVIRAX) 200 MG/5ML suspension SUSP 400 mg  Status:  Discontinued     400 mg Oral 3 times daily 01/03/18 1124 01/04/18 1010   01/03/18 0500  meropenem (MERREM) 1 g in sodium chloride 0.9 % 100 mL IVPB  Status:  Discontinued     1 g 200 mL/hr over 30 Minutes Intravenous Every 12 hours 01/10/2018 1628 01/03/18 1017   01/27/2018 2100  ceFEPIme (MAXIPIME) 1 g in sodium chloride 0.9 % 100 mL IVPB  Status:  Discontinued     1 g 200 mL/hr over 30 Minutes Intravenous Every 8 hours 01/13/2018 1512 01/07/2018 1533   01/27/2018 2000  fluconazole (DIFLUCAN) IVPB 400 mg  Status:  Discontinued     400 mg 100 mL/hr over 120 Minutes Intravenous Every 24 hours 01/14/2018 1809 01/03/18 1017   01/04/2018 1700  meropenem (MERREM) 2 g in sodium chloride 0.9 % 100 mL IVPB     2 g 200 mL/hr over 30 Minutes Intravenous  Once 01/22/2018 1620 01/08/2018 2226   01/04/2018 1230  ceFEPIme (MAXIPIME) 2 g in sodium chloride 0.9 % 100 mL IVPB     2 g 200 mL/hr over 30 Minutes Intravenous  Once 01/13/2018 1224 01/25/2018 1337   01/03/2018 1230  metroNIDAZOLE (FLAGYL) IVPB 500 mg  Status:  Discontinued     500 mg 100 mL/hr over 60 Minutes Intravenous Every 8 hours 01/08/2018 1224 01/25/2018 1533   01/29/2018 1230  vancomycin (VANCOCIN) IVPB 1000 mg/200 mL premix     1,000 mg 200 mL/hr over 60 Minutes Intravenous  Once 01/08/2018 1224 01/11/2018 1845        Objective:   Vitals:   01/05/18 2000 01/06/18 0559 01/07/18 0550 01/08/18 0519  BP:  111/77 109/74 98/68  Pulse: (!) 111 (!) 118 (!) 133 (!) 108  Resp: 10     Temp:  (!) 100.8 F (38.2 C) (!) 100.8 F (38.2 C) 97.9 F (36.6 C)  TempSrc:  Oral Oral Oral  SpO2: 100% 100% 94% 97%  Weight:      Height:        Wt Readings from Last 3 Encounters:  01/05/18 66.5 kg  08/26/16 67.2 kg  04/07/16 70.3 kg     Intake/Output Summary (Last 24 hours) at 01/08/2018 1610 Last data filed at 01/07/2018 2303 Gross per 24 hour  Intake 60 ml   Output 350 ml  Net -290 ml     Physical Exam Patient is examined daily including today on 01/08/18 , exams remain the same as of yesterday except that has changed   Gen:-Unresponsive, appears comfortable on fentanyl drip  HEENT:- [place].AT, conjunctiva edema Lungs-diminished bilaterally, no wheezing CV- S1, S2 normal, regular  Abd-  +ve B.Sounds, Abd Soft, No groans/No Moans on palpation tenderness,    Extremity/Skin:- No  edema, pedal pulses present  Neuro/Psych-unresponsive,  Data Review:   Micro Results Recent Results (from the past  240 hour(s))  Blood culture (routine x 2)     Status: Abnormal   Collection Time: 01/11/2018 12:48 PM  Result Value Ref Range Status   Specimen Description BLOOD PORTA CATH  Final   Special Requests   Final    BOTTLES DRAWN AEROBIC AND ANAEROBIC Blood Culture adequate volume   Culture  Setup Time   Final    GRAM NEGATIVE RODS ANAEROBIC BOTTLE ONLY CRITICAL RESULT CALLED TO, READ BACK BY AND VERIFIED WITH: Hughie Closs PHARMD, AT 0802 01/04/18 BY Rush Landmark Performed at [facility], [place] [address], [place], [place] [address]    Culture CITROBACTER FREUNDII (A)  Final   Report Status 01/05/2018 FINAL  Final   Organism ID, Bacteria CITROBACTER FREUNDII  Final      Susceptibility   Citrobacter freundii - MIC*    CEFAZOLIN >=64 RESISTANT Resistant     CEFEPIME <=1 SENSITIVE Sensitive     CEFTAZIDIME <=1 SENSITIVE Sensitive     CEFTRIAXONE <=1 SENSITIVE Sensitive     CIPROFLOXACIN <=0.25 SENSITIVE Sensitive     GENTAMICIN <=1 SENSITIVE Sensitive     IMIPENEM 4 SENSITIVE Sensitive     TRIMETH/SULFA <=20 SENSITIVE Sensitive     PIP/TAZO <=4 SENSITIVE Sensitive     * CITROBACTER FREUNDII  Blood culture (routine x 2)     Status: None   Collection Time: 01/28/2018  2:32 PM  Result Value Ref Range Status   Specimen Description BLOOD RIGHT ANTECUBITAL  Final   Special Requests   Final    BOTTLES DRAWN AEROBIC ONLY Blood Culture adequate volume    Culture   Final    NO GROWTH 5 DAYS Performed at [facility], [address], [place], [place] [address]    Report Status 01/07/2018 FINAL  Final  Urine culture     Status: None   Collection Time: 01/29/2018  3:32 PM  Result Value Ref Range Status   Specimen Description URINE, CATHETERIZED  Final   Special Requests NONE  Final   Culture   Final    NO GROWTH Performed at [facility], [place] [address] [place], [place] [address]    Report Status 01/03/2018 FINAL  Final  MRSA PCR Screening     Status: Abnormal   Collection Time: 01/27/2018  7:27 PM  Result Value Ref Range Status   MRSA by PCR POSITIVE (A) NEGATIVE Final    Comment:        The GeneXpert MRSA Assay (FDA approved for NASAL specimens only), is one component of a comprehensive MRSA colonization surveillance program. It is not intended to diagnose MRSA infection nor to guide or monitor treatment for MRSA infections. RESULT CALLED TO, READ BACK BY AND VERIFIED WITH: [place] 2344 01/06/2018 MITCHELL,L Performed at [facility], [place] [address], [place],  [address]   Gastrointestinal Panel by PCR , Stool     Status: None   Collection Time: 01/04/18  9:48 AM  Result Value Ref Range Status   Campylobacter species NOT DETECTED NOT DETECTED Final   Plesimonas shigelloides NOT DETECTED NOT DETECTED Final   Salmonella species NOT DETECTED NOT DETECTED Final   Yersinia enterocolitica NOT DETECTED NOT DETECTED Final   Vibrio species NOT DETECTED NOT DETECTED Final   Vibrio cholerae NOT DETECTED NOT DETECTED Final   Enteroaggregative E coli (EAEC) NOT DETECTED NOT DETECTED Final   Enteropathogenic E coli (EPEC) NOT DETECTED NOT DETECTED Final   Enterotoxigenic E coli (ETEC) NOT  DETECTED NOT DETECTED Final   Shiga like toxin producing E coli (STEC) NOT DETECTED NOT DETECTED Final   Shigella/Enteroinvasive E coli (EIEC) NOT DETECTED NOT DETECTED Final   Cryptosporidium NOT DETECTED NOT DETECTED Final    Cyclospora cayetanensis NOT DETECTED NOT DETECTED Final   Entamoeba histolytica NOT DETECTED NOT DETECTED Final   Giardia lamblia NOT DETECTED NOT DETECTED Final   Adenovirus F40/41 NOT DETECTED NOT DETECTED Final   Astrovirus NOT DETECTED NOT DETECTED Final   Norovirus GI/GII NOT DETECTED NOT DETECTED Final   Rotavirus A NOT DETECTED NOT DETECTED Final   Sapovirus (I, II, IV, and V) NOT DETECTED NOT DETECTED Final    Comment: Performed at [facility], [address] [place], [place] [address]    Radiology Reports Ct Abdomen Pelvis Wo Contrast  Result Date: 01/04/2018 CLINICAL DATA:  Left-sided weakness. Recent chemotherapy for lymphoma presents today post fall. Found this morning on ground. EXAM: CT CHEST, ABDOMEN AND PELVIS WITHOUT CONTRAST TECHNIQUE: Multidetector CT imaging of the chest, abdomen and pelvis was performed following the standard protocol without IV contrast. COMPARISON:  MRI pelvis 12/27/2017 FINDINGS: CT CHEST FINDINGS Cardiovascular: Right IJ Port-A-Cath has tip in the SVC. Heart is normal size. Mild calcified plaque over the thoracic aorta. Mild calcified plaque over the left main and 3 vessel coronary arteries. Remaining vascular structures are unremarkable. Mediastinum/Nodes: No evidence of mediastinal or hilar adenopathy. Remaining mediastinal structures are unremarkable. Lungs/Pleura: Lungs are adequately inflated without focal airspace consolidation or effusion. There is minimal bibasilar dependent atelectasis. Airways are normal. Musculoskeletal: Degenerative change of the spine. CT ABDOMEN PELVIS FINDINGS Hepatobiliary: Liver and biliary tree are normal. Gallbladder is within normal. Pancreas: Normal. Spleen: Normal. Adrenals/Urinary Tract: Adrenal glands are normal. Kidneys normal size without hydronephrosis or nephrolithiasis. Couple small calcifications over the renal hilum likely vascular. 2.2 cm cyst over the mid pole right kidney. Ureters and bladder are  normal. Stomach/Bowel: Stomach and small bowel are normal. Appendix is normal. Colon is normal. Vascular/Lymphatic: Mild-to-moderate calcified plaque over the abdominal aorta. No adenopathy. Reproductive: Uterus and left ovary are normal. Solid right adnexal mass measuring 4.1 x 6.5 cm unchanged from recent MRI. Other: No free fluid or focal inflammatory change. Musculoskeletal: Degenerative change of the spine. No lytic/sclerotic lesions. IMPRESSION: No acute findings in the chest, abdomen or pelvis. Known solid right adnexal mass measuring 4.1 x 6.5 cm unchanged from recent MRI and likely neoplastic in nature. No adenopathy. Continue to recommend surgical consultation. 2.2 cm right renal cyst. Aortic Atherosclerosis (ICD10-I70.0). Atherosclerotic coronary artery disease. Electronically Signed   By: Marin Olp M.D.   On: 01/15/2018 16:55   Dg Chest 1 View  Result Date: 01/03/2018 CLINICAL DATA:  62 year old female with shortness of breath. EXAM: CHEST  1 VIEW COMPARISON:  Chest radiograph dated 01/26/2018 FINDINGS: Right pectoral infusion catheter with tip over central SVC in similar position. Slight interstitial prominence may represent mild edema. Bilateral perihilar and left infrahilar streaky densities may represent atelectasis although infiltrate is not excluded. There is no pleural effusion or pneumothorax. There is mild cardiomegaly. Atherosclerotic calcification of the aortic arch. No acute osseous pathology. Degenerative changes of the spine. IMPRESSION: 1. Mild cardiomegaly with possible mild edema. 2. Bilateral perihilar and left infrahilar atelectasis versus infiltrate. Electronically Signed   By: Anner Crete M.D.   On: 01/03/2018 21:21   Dg Pelvis 1-2 Views  Result Date: 01/12/2018 CLINICAL DATA:  Fall EXAM: PELVIS - 1-2 VIEW COMPARISON:  None. FINDINGS: There is no evidence  of pelvic fracture or diastasis. No pelvic bone lesions are seen. IMPRESSION: Negative. Electronically Signed    By: Franchot Gallo M.D.   On: 01/13/2018 13:01   Ct Head Wo Contrast  Result Date: 01/08/2018 CLINICAL DATA:  Left-sided weakness with recent fall history of lymphoma EXAM: CT HEAD WITHOUT CONTRAST CT CERVICAL SPINE WITHOUT CONTRAST TECHNIQUE: Multidetector CT imaging of the head and cervical spine was performed following the standard protocol without intravenous contrast. Multiplanar CT image reconstructions of the cervical spine were also generated. COMPARISON:  MRI 03/14/2007 FINDINGS: CT HEAD FINDINGS Brain: No acute territorial infarction, hemorrhage or intracranial mass. Mild atrophy. Mild small vessel ischemic changes of the white matter. Chronic lacunar infarct in the left basal ganglia. Stable ventricle size. Vascular: No hyperdense vessels.  Carotid vascular calcification Skull: Normal. Negative for fracture or focal lesion. Sinuses/Orbits: No acute orbital abnormality. Moderate mucosal opacification of the ethmoid sinuses. Complete opacification of right maxillary sinus with small air bubbles. Mucosal thickening left maxillary sinus. Extension of opacifications into the right nasal passage. Other: None CT CERVICAL SPINE FINDINGS Alignment: Straightening of the cervical spine. No subluxation. Facet alignment within normal limits. Skull base and vertebrae: No acute fracture. No primary bone lesion or focal pathologic process. Soft tissues and spinal canal: No prevertebral fluid or swelling. No visible canal hematoma. Disc levels:  Mild degenerative changes at C4-C5 and C5-C6. Upper chest: Negative. Other: None IMPRESSION: 1. No CT evidence for acute intracranial abnormality. Atrophy and small vessel ischemic changes of the white matter. Chronic lacunar infarct in the left basal ganglia 2. No acute osseous abnormality of the cervical spine 3. Paranasal sinus disease Electronically Signed   By: Donavan Foil M.D.   On: 01/22/2018 16:52   Ct Chest Wo Contrast  Result Date: 01/03/2018 CLINICAL DATA:   Left-sided weakness. Recent chemotherapy for lymphoma presents today post fall. Found this morning on ground. EXAM: CT CHEST, ABDOMEN AND PELVIS WITHOUT CONTRAST TECHNIQUE: Multidetector CT imaging of the chest, abdomen and pelvis was performed following the standard protocol without IV contrast. COMPARISON:  MRI pelvis 12/27/2017 FINDINGS: CT CHEST FINDINGS Cardiovascular: Right IJ Port-A-Cath has tip in the SVC. Heart is normal size. Mild calcified plaque over the thoracic aorta. Mild calcified plaque over the left main and 3 vessel coronary arteries. Remaining vascular structures are unremarkable. Mediastinum/Nodes: No evidence of mediastinal or hilar adenopathy. Remaining mediastinal structures are unremarkable. Lungs/Pleura: Lungs are adequately inflated without focal airspace consolidation or effusion. There is minimal bibasilar dependent atelectasis. Airways are normal. Musculoskeletal: Degenerative change of the spine. CT ABDOMEN PELVIS FINDINGS Hepatobiliary: Liver and biliary tree are normal. Gallbladder is within normal. Pancreas: Normal. Spleen: Normal. Adrenals/Urinary Tract: Adrenal glands are normal. Kidneys normal size without hydronephrosis or nephrolithiasis. Couple small calcifications over the renal hilum likely vascular. 2.2 cm cyst over the mid pole right kidney. Ureters and bladder are normal. Stomach/Bowel: Stomach and small bowel are normal. Appendix is normal. Colon is normal. Vascular/Lymphatic: Mild-to-moderate calcified plaque over the abdominal aorta. No adenopathy. Reproductive: Uterus and left ovary are normal. Solid right adnexal mass measuring 4.1 x 6.5 cm unchanged from recent MRI. Other: No free fluid or focal inflammatory change. Musculoskeletal: Degenerative change of the spine. No lytic/sclerotic lesions. IMPRESSION: No acute findings in the chest, abdomen or pelvis. Known solid right adnexal mass measuring 4.1 x 6.5 cm unchanged from recent MRI and likely neoplastic in nature.  No adenopathy. Continue to recommend surgical consultation. 2.2 cm right renal cyst. Aortic Atherosclerosis (ICD10-I70.0). Atherosclerotic coronary artery  disease. Electronically Signed   By: Marin Olp M.D.   On: 01/05/2018 16:55   Ct Cervical Spine Wo Contrast  Result Date: 01/10/2018 CLINICAL DATA:  Left-sided weakness with recent fall history of lymphoma EXAM: CT HEAD WITHOUT CONTRAST CT CERVICAL SPINE WITHOUT CONTRAST TECHNIQUE: Multidetector CT imaging of the head and cervical spine was performed following the standard protocol without intravenous contrast. Multiplanar CT image reconstructions of the cervical spine were also generated. COMPARISON:  MRI 03/14/2007 FINDINGS: CT HEAD FINDINGS Brain: No acute territorial infarction, hemorrhage or intracranial mass. Mild atrophy. Mild small vessel ischemic changes of the white matter. Chronic lacunar infarct in the left basal ganglia. Stable ventricle size. Vascular: No hyperdense vessels.  Carotid vascular calcification Skull: Normal. Negative for fracture or focal lesion. Sinuses/Orbits: No acute orbital abnormality. Moderate mucosal opacification of the ethmoid sinuses. Complete opacification of right maxillary sinus with small air bubbles. Mucosal thickening left maxillary sinus. Extension of opacifications into the right nasal passage. Other: None CT CERVICAL SPINE FINDINGS Alignment: Straightening of the cervical spine. No subluxation. Facet alignment within normal limits. Skull base and vertebrae: No acute fracture. No primary bone lesion or focal pathologic process. Soft tissues and spinal canal: No prevertebral fluid or swelling. No visible canal hematoma. Disc levels:  Mild degenerative changes at C4-C5 and C5-C6. Upper chest: Negative. Other: None IMPRESSION: 1. No CT evidence for acute intracranial abnormality. Atrophy and small vessel ischemic changes of the white matter. Chronic lacunar infarct in the left basal ganglia 2. No acute osseous  abnormality of the cervical spine 3. Paranasal sinus disease Electronically Signed   By: Donavan Foil M.D.   On: 01/24/2018 16:52   Dg Chest Port 1 View  Result Date: 01/07/2018 CLINICAL DATA:  Fall.  History of lymphoma EXAM: PORTABLE CHEST 1 VIEW COMPARISON:  Chest two-view 03/15/2007 FINDINGS: Heart size mildly enlarged. Negative for heart failure. Atherosclerotic aortic arch. Lungs are clear without infiltrate or effusion. Port-A-Cath tip in the SVC. IMPRESSION: No active disease. Electronically Signed   By: Franchot Gallo M.D.   On: 01/22/2018 13:00   Dg Knee Left Port  Result Date: 01/06/2018 CLINICAL DATA:  Fall EXAM: PORTABLE LEFT KNEE - 1-2 VIEW COMPARISON:  None. FINDINGS: No evidence of fracture, dislocation, or joint effusion. No evidence of arthropathy or other focal bone abnormality. Soft tissues are unremarkable. IMPRESSION: Negative. Electronically Signed   By: Franchot Gallo M.D.   On: 01/21/2018 13:01   Dg Knee Right Port  Result Date: 01/30/2018 CLINICAL DATA:  Fall EXAM: PORTABLE RIGHT KNEE - 1-2 VIEW COMPARISON:  None. FINDINGS: No evidence of fracture, dislocation, or joint effusion. No evidence of arthropathy or other focal bone abnormality. Soft tissues are unremarkable. IMPRESSION: Negative. Electronically Signed   By: Franchot Gallo M.D.   On: 01/24/2018 13:06   Ct Head Code Stroke Wo Contrast  Result Date: 01/03/2018 CLINICAL DATA:  Code stroke.  62 y/o  F; left facial droop. EXAM: CT HEAD WITHOUT CONTRAST TECHNIQUE: Contiguous axial images were obtained from the base of the skull through the vertex without intravenous contrast. COMPARISON:  01/21/2018 CT head. FINDINGS: Brain: No evidence of acute infarction, hemorrhage, hydrocephalus, extra-axial collection or mass lesion/mass effect. Small chronic infarcts are present within the bilateral caudate bodies and the left corona radiata. Few nonspecific white matter hypodensities compatible with chronic microvascular ischemic  changes are stable. Stable mild volume loss of the brain. Vascular: Calcific atherosclerosis of carotid siphons and vertebral arteries. No hyperdense vessel identified. Skull: Normal. Negative  for fracture or focal lesion. Sinuses/Orbits: Extensive paranasal sinus disease with complete opacification of right maxillary sinus. Normal aeration of the mastoid air cells. Bilateral intra-ocular lens replacement. Other: None. ASPECTS [facility] Stroke Program Early CT Score) - Ganglionic level infarction (caudate, lentiform nuclei, internal capsule, insula, M1-M3 cortex): 7 - Supraganglionic infarction (M4-M6 cortex): 3 Total score (0-10 with 10 being normal): 10 IMPRESSION: 1. No acute intracranial abnormality identified. Stable CT of the head. 2. ASPECTS is 10 3. Stable chronic microvascular ischemic changes and volume loss of the brain. Stable bilateral chronic lacunar infarcts in the basal ganglia. These results were communicated to Dr. Lorraine Lax at [place] 12/2/2019by text page via the [facility] messaging system. Electronically Signed   By: Kristine Garbe M.D.   On: 01/03/2018 19:17   Vas US Carotid  Result Date: 01/05/2018 Carotid Arterial Duplex Study Indications: CVA. Performing Technologist: Maudry Mayhew MHA, RDMS, RVT, RDCS  Examination Guidelines: A complete evaluation includes B-mode imaging, spectral Doppler, color Doppler, and power Doppler as needed of all accessible portions of each vessel. Bilateral testing is considered an integral part of a complete examination. Limited examinations for reoccurring indications may be performed as noted.  Right Carotid Findings: +----------+-------+-------+--------+------------------------+-----------------+           PSV    EDV    StenosisDescribe                Comments                    cm/s   cm/s                                                     +----------+-------+-------+--------+------------------------+-----------------+ CCA Prox   59     11                                                       +----------+-------+-------+--------+------------------------+-----------------+ CCA Distal46     14                                     intimal                                                                   thickening        +----------+-------+-------+--------+------------------------+-----------------+ ICA Prox  64     29             heterogenous and                                                          irregular                                 +----------+-------+-------+--------+------------------------+-----------------+  ICA Distal60     24             smooth and heterogenous                   +----------+-------+-------+--------+------------------------+-----------------+ ECA       97     13             smooth and heterogenous                   +----------+-------+-------+--------+------------------------+-----------------+ +----------+--------+-------+----------------+-------------------+           PSV cm/sEDV cmsDescribe        Arm Pressure (mmHG) +----------+--------+-------+----------------+-------------------+ Subclavian               Multiphasic, WNL                    +----------+--------+-------+----------------+-------------------+ +---------+--------+--+--------+--+---------+ VertebralPSV cm/s45EDV cm/s13Antegrade +---------+--------+--+--------+--+---------+  Left Carotid Findings: +----------+-------+-------+--------+---------------------------------+--------+           PSV    EDV    StenosisDescribe                         Comments           cm/s   cm/s                                                     +----------+-------+-------+--------+---------------------------------+--------+ CCA Prox  91     19             homogeneous and smooth                    +----------+-------+-------+--------+---------------------------------+--------+ CCA  Distal36     17             smooth and heterogenous                   +----------+-------+-------+--------+---------------------------------+--------+ ICA Prox  78     32             irregular, heterogenous and                                               calcific                                  +----------+-------+-------+--------+---------------------------------+--------+ ICA Distal73     28                                                       +----------+-------+-------+--------+---------------------------------+--------+ ECA       70     9                                                        +----------+-------+-------+--------+---------------------------------+--------+ +----------+--------+--------+----------------+-------------------+ SubclavianPSV cm/sEDV cm/sDescribe  Arm Pressure (mmHG) +----------+--------+--------+----------------+-------------------+           83              Multiphasic, WNL                    +----------+--------+--------+----------------+-------------------+ +---------+--------+--+--------+--+---------+ VertebralPSV cm/s44EDV cm/s15Antegrade +---------+--------+--+--------+--+---------+  Summary: Right Carotid: Velocities in the right ICA are consistent with a 1-39% stenosis. Left Carotid: Velocities in the left ICA are consistent with a 1-39% stenosis. Vertebrals:  Bilateral vertebral arteries demonstrate antegrade flow. Subclavians: Normal flow hemodynamics were seen in bilateral subclavian              arteries. *See table(s) above for measurements and observations.  Electronically signed by Antony Contras MD on 01/05/2018 at 8:42:00 AM.    Final    Vas [place] Lower Extremity Venous (dvt)  Result Date: 01/05/2018  Lower Venous Study Indications: Stroke.  Performing Technologist: Maudry Mayhew MHA, RDMS, RVT, RDCS  Examination Guidelines: A complete evaluation includes B-mode imaging, spectral Doppler, color  Doppler, and power Doppler as needed of all accessible portions of each vessel. Bilateral testing is considered an integral part of a complete examination. Limited examinations for reoccurring indications may be performed as noted.  Right Venous Findings: +---------+---------------+---------+-----------+----------+-------------------+          CompressibilityPhasicitySpontaneityPropertiesSummary             +---------+---------------+---------+-----------+----------+-------------------+ CFV      Full           Yes      Yes                                      +---------+---------------+---------+-----------+----------+-------------------+ SFJ      Full                                                             +---------+---------------+---------+-----------+----------+-------------------+ FV Prox  Full                                                             +---------+---------------+---------+-----------+----------+-------------------+ FV Mid   Full                                                             +---------+---------------+---------+-----------+----------+-------------------+ FV DistalFull                                                             +---------+---------------+---------+-----------+----------+-------------------+ PFV      Full                                                             +---------+---------------+---------+-----------+----------+-------------------+  POP      Full           Yes      Yes                                      +---------+---------------+---------+-----------+----------+-------------------+ PTV      Full                                                             +---------+---------------+---------+-----------+----------+-------------------+ PERO     None                                         Acute                +---------+---------------+---------+-----------+----------+-------------------+ GSV      None                                         At saphenofemoral                                                         junction            +---------+---------------+---------+-----------+----------+-------------------+  Left Venous Findings: +---------+---------------+---------+-----------+----------+-------------------+          CompressibilityPhasicitySpontaneityPropertiesSummary             +---------+---------------+---------+-----------+----------+-------------------+ CFV      Full           Yes      Yes                                      +---------+---------------+---------+-----------+----------+-------------------+ SFJ      Full                                                             +---------+---------------+---------+-----------+----------+-------------------+ FV Prox  Full                                                             +---------+---------------+---------+-----------+----------+-------------------+ FV Mid   Full                                                             +---------+---------------+---------+-----------+----------+-------------------+  FV DistalFull                                                             +---------+---------------+---------+-----------+----------+-------------------+ PFV      Full                                                             +---------+---------------+---------+-----------+----------+-------------------+ POP      Full           Yes      Yes                                      +---------+---------------+---------+-----------+----------+-------------------+ PTV      Full                                                             +---------+---------------+---------+-----------+----------+-------------------+ PERO     Full                                                              +---------+---------------+---------+-----------+----------+-------------------+ GSV      None                                         0.6cm from                                                                saphenofemoral                                                            junction            +---------+---------------+---------+-----------+----------+-------------------+    Summary: Right: Findings consistent with acute deep vein thrombosis involving the right peroneal vein. Findings consistent with acute superficial vein thrombosis involving the right great saphenous vein at the saphenofemoral junction. No cystic structure found in  the popliteal fossa. Left: Findings consistent with acute superficial vein thrombosis involving the left great saphenous vein 0.6cm away from the saphenofemoral junction. There is no evidence of deep vein thrombosis in the lower extremity. No cystic structure found in the popliteal fossa.  *See table(s)  above for measurements and observations. Electronically signed by Quay Burow MD on 01/05/2018 at 4:58:32 PM.    Final      CBC Recent Labs  Lab 01/12/2018 1247 01/28/2018 2049 01/03/18 5053 01/03/18 9767 01/03/18 1652 01/04/18 0213 01/04/18 0517 01/05/18 0121  WBC 0.4* 0.3* 0.6*  --   --   --  3.1* 7.6  HGB 3.3* 8.8* 8.7* 8.7* 9.4* 9.7* 9.5* 10.1*  HCT 11.2* 27.2* 26.5* 27.9* 29.1* 28.9* 29.4* 31.9*  PLT 32* 27* 25*  --   --   --  23* 20*  MCV 97.4 90.4 89.2  --   --   --  89.1 92.7  MCH 28.7 29.2 29.3  --   --   --  28.8 29.4  MCHC 29.5* 32.4 32.8  --   --   --  32.3 31.7  RDW 17.3* 15.6* 17.2*  --   --   --  17.5* 17.9*  LYMPHSABS  --   --   --   --   --   --  0.1* 0.4*  MONOABS  --   --   --   --   --   --  0.4 0.8  EOSABS  --   --   --   --   --   --  0.0 0.0  BASOSABS  --   --   --   --   --   --  0.0 0.1    Chemistries  Recent Labs  Lab 01/08/2018 1247 01/31/2018 1748 01/03/18 0650 01/04/18 0517  01/05/18 0114 01/05/18 0121  NA 130* 132* 137 139  --  141  K 3.1* 3.0* 2.9* 3.0*  --  4.2  CL 98 103 107 112*  --  117*  CO2 14* 21* 20* 16*  --  14*  GLUCOSE 212* 169* 117* 146*  --  230*  BUN 43* 43* 42* 43*  --  50*  CREATININE 2.48* 2.02* 1.84* 1.62*  --  1.47*  CALCIUM 8.2* 7.4* 7.9* 8.2*  --  8.3*  MG  --  2.0 2.0 2.0 1.8  --   AST 63*  --   --   --   --  67*  ALT 33  --   --   --   --  31  ALKPHOS 44  --   --   --   --  48  BILITOT 1.8*  --   --   --   --  2.0*      Lab Results  Component Value Date   HGBA1C 6.0 (H) 01/04/2018   ---------------------------------------------------------------------------------- Coagulation profile Recent Labs  Lab 01/10/2018 1748  INR 1.46     Cardiac Enzymes Recent Labs  Lab 01/03/18 0822 01/03/18 1503 01/03/18 2059  TROPONINI 6.72* 4.81* 2.51*      Nikya Busler M.D on 01/08/2018 at 4:10 PM  Pager---[phone] Go to [email or web address] - password [email or web address] for contact info  [facility] - Office  [phone]

## 2018-02-02 NOTE — Progress Notes (Signed)
Patient found shallow breathing, faint heart beat. Passed while nurse in room. Angela Nevin RN verified, time of death 0607. Bodenheimer paged, daughter Becky Augusta called [phone]. Patient to call brother, will let us know if coming to unit.

## 2018-02-02 NOTE — Progress Notes (Signed)
Wasted 55 mL morphine with Nellie RN.

## 2018-02-02 NOTE — Plan of Care (Signed)
  Problem: Coping: Goal: Level of anxiety will decrease Outcome: Progressing   Problem: Pain Managment: Goal: General experience of comfort will improve Outcome: Progressing   

## 2018-02-02 NOTE — Discharge Summary (Signed)
[name] OHF:[id number] DOB: 06/11/1955 DOA: 30-Jan-2018  PCP: Hayden Rasmussen, MD PCP/Office notified:   Admit date: Jan 30, 2018 Date of Death: 02/06/2018   Time of Death : 0607 am on 2018-02-06   Final Diagnoses:  Active Problems:   Neutropenic sepsis (HCC)   Chemotherapy-induced neutropenia (HCC)   Non-STEMI (non-ST elevated myocardial infarction) (HCC)   LV dysfunction   Acute respiratory failure with hypoxia (HCC)   Sepsis with acute renal failure (HCC)   SOB (shortness of breath)   Cerebral embolism with cerebral infarction   Goals of care, counseling/discussion   Palliative care by specialist   Comfort measures only status    History of present illness:   HPI on Admission as documented by Admitting Physician:-  63 year old female w/ multiple co-morbids as listed below. Just completed last cycle of Chemo 11/20. Had been instructed to take Etoposide 3 tabs instead of 5 (but she took all 5). Did c/o watery diarrhea and sores on her mouth  starting  11/29. Presented via EMS after being found on floor at home after an unwitnessed fall. On EMS arrival SBP 70s. 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 No tPA given due to thrombocytopenia.   Patient was made comfort measures only---   Time of Death : 0607 am on 02/06/18   Plan:- 1)Neutropenic fever with Citrobacter Freundi sepsis----remains unresponsive, per family request Patient was made comfort measures only  2) suspected acute stroke possibly right cortical infarct----patient is hypercoagulable due to underlying malignancy, also had significant anemia with hemoglobin less than 4 , as  well as persistent hypotension, cannot rule out CNS metastases, MRI not done due to hemodynamic and cardiopulmonary instability-family has decided to make patient comfort measures only   Neurology input appreciated  3) anemia and thrombocytopenia----no further transfusions at this time Patient was made comfort measures only   Social/Ethics--- DNR/DNI, Patient was made comfort measures only    Time of Death : 0607 am on 06-Feb-2018   Signed:  Chloeanne Poteet  [facility] February 06, 2018, 9:54 AM

## 2018-02-02 DEATH — deceased

## 2018-07-24 IMAGING — RF DG C-ARM 61-120 MIN
1 series · 3 of 3 positions shown · non-contrast
Comparison: None.

FLUOROSCOPY TIME:  50 seconds

CLINICAL DATA: Right fibular ORIF

EXAM:
DG C-ARM 61-120 MIN; RIGHT ANKLE - COMPLETE 3+ VIEW

[Series 1: run · 3 of 3 slices shown]
[im 1/3]
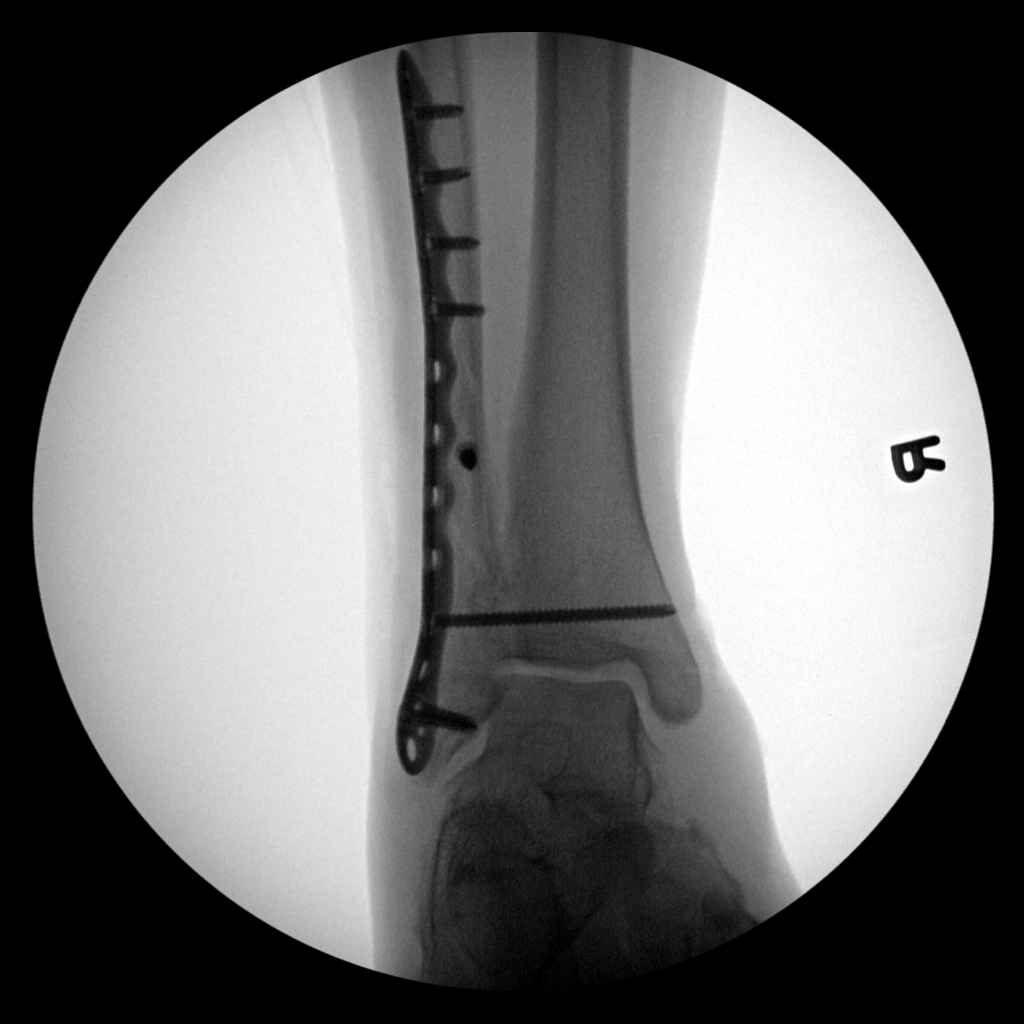
[im 2/3]
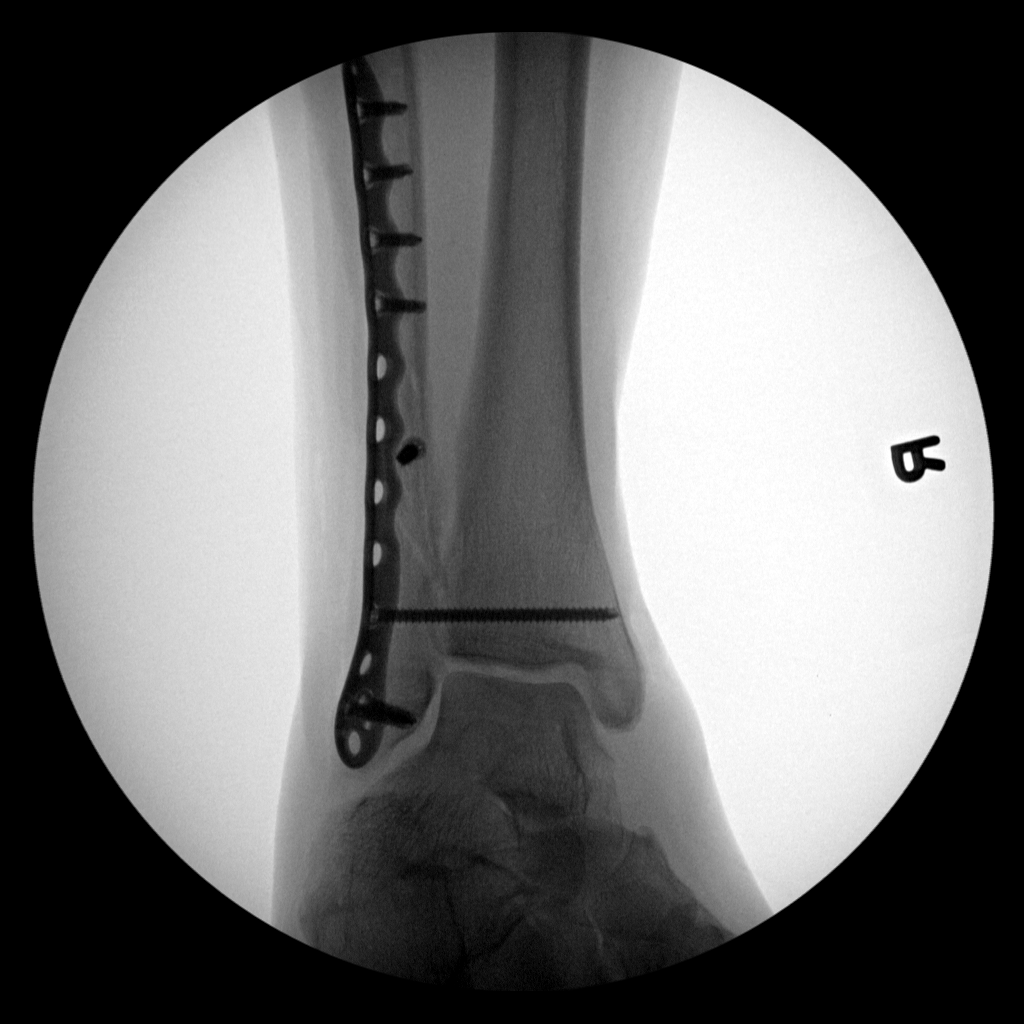
[im 3/3]
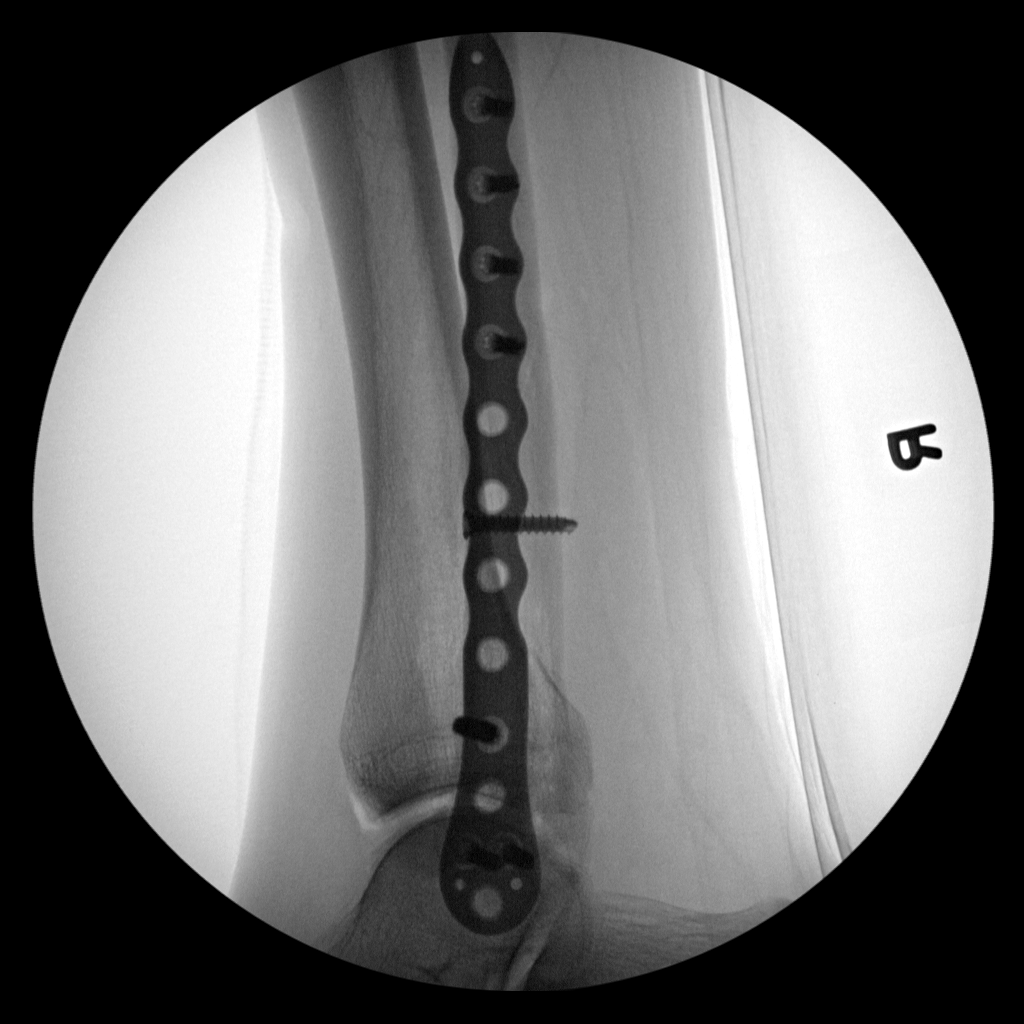

[3 of 3 positions shown; findings below may reference images not displayed]

FINDINGS: Oblique distal fibular fracture transfixed with a lateral sideplate
and multiple interlocking screws. Single syndesmotic screw is
present in satisfactory position. No hardware failure or
complication. Normal ankle mortise. Anatomic alignment.
IMPRESSION: 1. Interval distal fibular fracture ORIF.

## 2020-04-11 IMAGING — CT CT CERVICAL SPINE W/O CM
5 of 8 series · 12 of 33 positions shown, 13 images · non-contrast
Comparison: MRI 03/14/2007

CLINICAL DATA: Left-sided weakness with recent fall history of
lymphoma

EXAM:
CT HEAD WITHOUT CONTRAST
CT CERVICAL SPINE WITHOUT CONTRAST
TECHNIQUE: Multidetector CT imaging of the head and cervical spine was
performed following the standard protocol without intravenous
contrast. Multiplanar CT image reconstructions of the cervical spine
were also generated.

[Series 4: head bone · axial · 0.45mm/px · z∈[+1314,+1366]mm · 2 of 80 slices shown]
[im 27/80  bone]
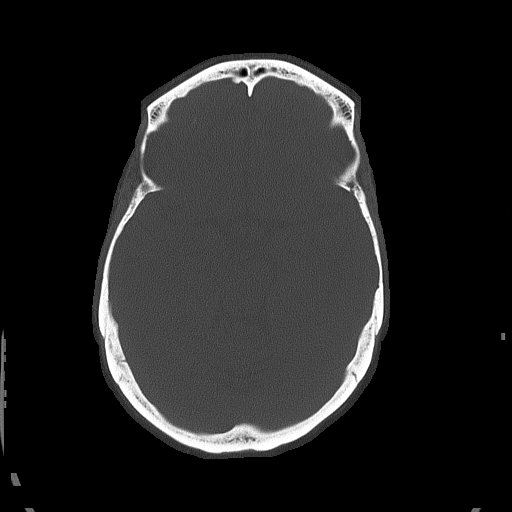
[im 53/80  bone]
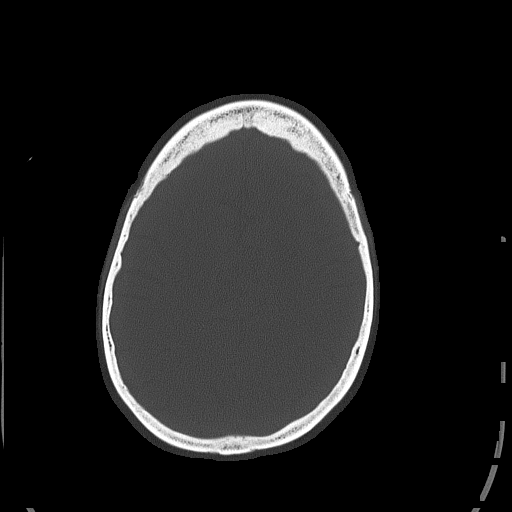

[Series 7: c_spine 2.0 st · axial · 0.32mm/px · z∈[+1175,+1225]mm · 2 of 75 slices shown, 3 images]
[im 25/75  soft-tissue]
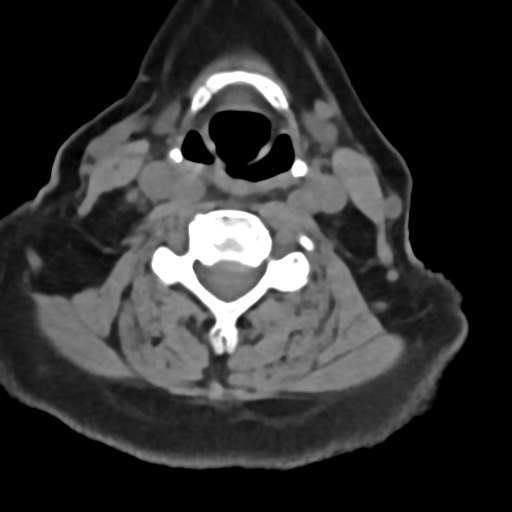
[im 25/75  bone]
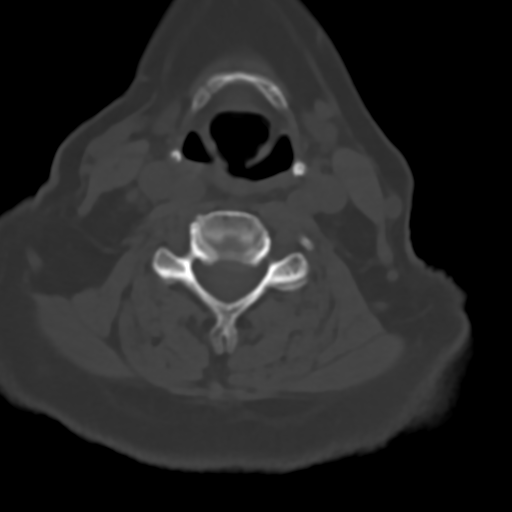
[im 50/75  bone]
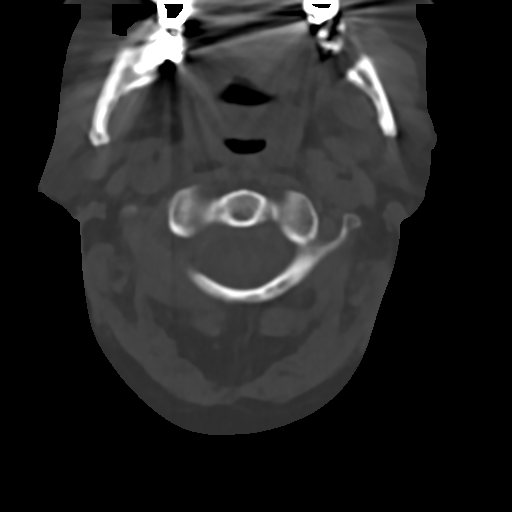

[Series 9: c_spine 2.0 sag bone · sagittal · 0.29mm/px · 5 of 61 slices shown]
[im 11/61  bone]
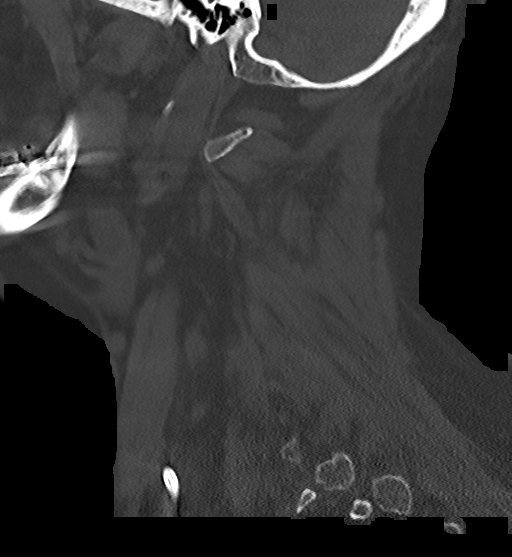
[im 21/61  bone]
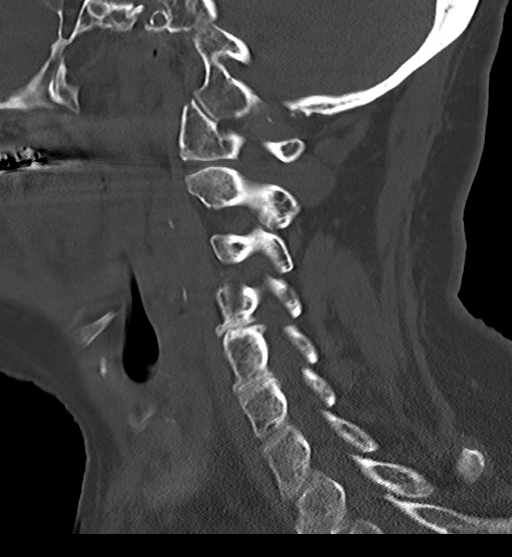
[im 31/61  bone]
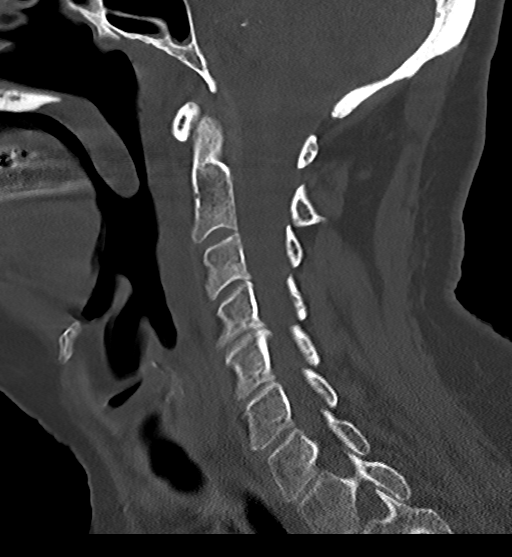
[im 41/61  bone]
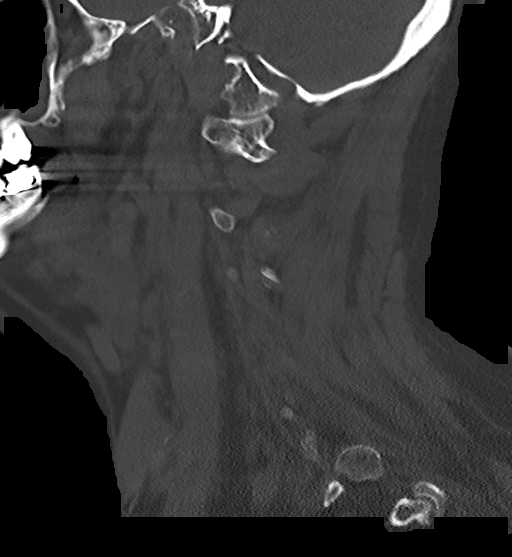
[im 51/61  bone]
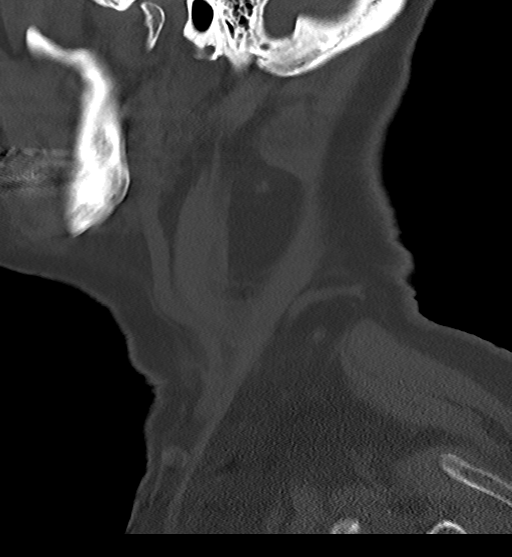

[Series 10: c_spine 2.0 cor bone · coronal · 0.23mm/px · 1 of 61 slices shown]
[im 31/61  bone]
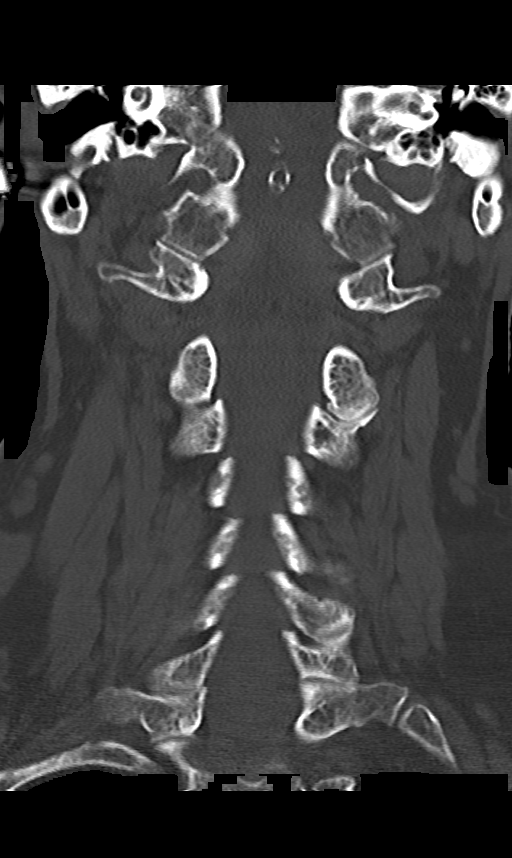

[Series 12: c_spine 2.0 orthogonals · axial · 0.21mm/px · z∈[+1149,+1198]mm · 2 of 75 slices shown]
[im 25/75  bone]
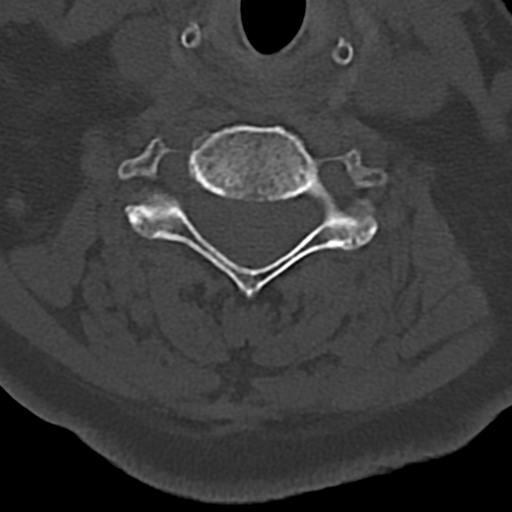
[im 50/75  bone]
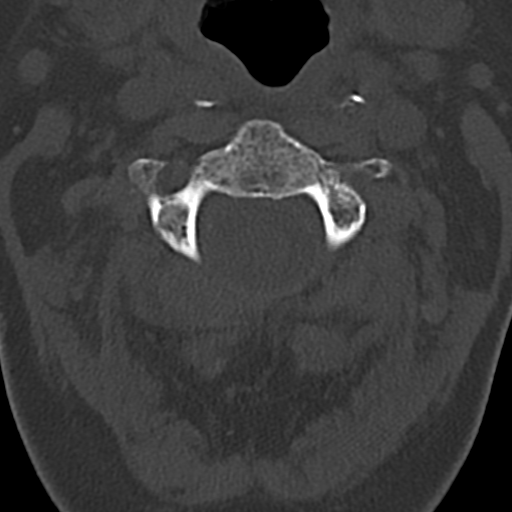

[12 of 33 positions shown; findings below may reference images not displayed]

FINDINGS: CT HEAD FINDINGS

Brain: No acute territorial infarction, hemorrhage or intracranial
mass. Mild atrophy. Mild small vessel ischemic changes of the white
matter. Chronic lacunar infarct in the left basal ganglia. Stable
ventricle size.

Vascular: No hyperdense vessels.  Carotid vascular calcification

Skull: Normal. Negative for fracture or focal lesion.

Sinuses/Orbits: No acute orbital abnormality. Moderate mucosal
opacification of the ethmoid sinuses. Complete opacification of
right maxillary sinus with small air bubbles. Mucosal thickening
left maxillary sinus. Extension of opacifications into the right
nasal passage.

Other: None

CT CERVICAL SPINE FINDINGS

Alignment: Straightening of the cervical spine. No subluxation.
Facet alignment within normal limits.

Skull base and vertebrae: No acute fracture. No primary bone lesion
or focal pathologic process.

Soft tissues and spinal canal: No prevertebral fluid or swelling. No
visible canal hematoma.

Disc levels:  Mild degenerative changes at C4-C5 and C5-C6.

Upper chest: Negative.

Other: None
IMPRESSION: 1. No CT evidence for acute intracranial abnormality. Atrophy and
small vessel ischemic changes of the white matter. Chronic lacunar
infarct in the left basal ganglia
2. No acute osseous abnormality of the cervical spine
3. Paranasal sinus disease
